# Patient Record
Sex: Female | Born: 1970 | Race: White | Hispanic: No | Smoking: Former smoker
Health system: Southern US, Community
[De-identification: ages and names within clinical notes are randomized; demographics above are authoritative.]

## PROBLEM LIST (undated history)

## (undated) DIAGNOSIS — E119 Type 2 diabetes mellitus without complications: Secondary | ICD-10-CM

## (undated) DIAGNOSIS — I10 Essential (primary) hypertension: Secondary | ICD-10-CM

---

## 1997-12-03 ENCOUNTER — Ambulatory Visit (HOSPITAL_COMMUNITY): Admission: RE | Admit: 1997-12-03 | Discharge: 1997-12-03 | Payer: Self-pay

## 1998-06-16 ENCOUNTER — Emergency Department (HOSPITAL_COMMUNITY): Admission: EM | Admit: 1998-06-16 | Discharge: 1998-06-16 | Payer: Self-pay | Admitting: Emergency Medicine

## 1998-09-17 ENCOUNTER — Emergency Department (HOSPITAL_COMMUNITY): Admission: EM | Admit: 1998-09-17 | Discharge: 1998-09-17 | Payer: Self-pay | Admitting: Internal Medicine

## 1998-09-17 ENCOUNTER — Encounter: Payer: Self-pay | Admitting: Internal Medicine

## 1999-01-01 ENCOUNTER — Encounter: Payer: Self-pay | Admitting: Vascular Surgery

## 1999-01-04 ENCOUNTER — Ambulatory Visit (HOSPITAL_COMMUNITY): Admission: RE | Admit: 1999-01-04 | Discharge: 1999-01-04 | Payer: Self-pay | Admitting: Vascular Surgery

## 1999-08-19 ENCOUNTER — Emergency Department (HOSPITAL_COMMUNITY): Admission: EM | Admit: 1999-08-19 | Discharge: 1999-08-19 | Payer: Self-pay | Admitting: Emergency Medicine

## 1999-10-26 ENCOUNTER — Emergency Department (HOSPITAL_COMMUNITY): Admission: EM | Admit: 1999-10-26 | Discharge: 1999-10-26 | Payer: Self-pay | Admitting: Emergency Medicine

## 1999-10-26 ENCOUNTER — Encounter: Payer: Self-pay | Admitting: Emergency Medicine

## 2000-05-12 ENCOUNTER — Emergency Department (HOSPITAL_COMMUNITY): Admission: EM | Admit: 2000-05-12 | Discharge: 2000-05-12 | Payer: Self-pay | Admitting: Internal Medicine

## 2000-07-12 ENCOUNTER — Emergency Department (HOSPITAL_COMMUNITY): Admission: EM | Admit: 2000-07-12 | Discharge: 2000-07-12 | Payer: Self-pay | Admitting: Emergency Medicine

## 2000-07-14 ENCOUNTER — Emergency Department (HOSPITAL_COMMUNITY): Admission: EM | Admit: 2000-07-14 | Discharge: 2000-07-14 | Payer: Self-pay | Admitting: Emergency Medicine

## 2000-07-30 ENCOUNTER — Emergency Department (HOSPITAL_COMMUNITY): Admission: EM | Admit: 2000-07-30 | Discharge: 2000-07-30 | Payer: Self-pay | Admitting: *Deleted

## 2000-10-19 ENCOUNTER — Inpatient Hospital Stay (HOSPITAL_COMMUNITY): Admission: AD | Admit: 2000-10-19 | Discharge: 2000-10-19 | Payer: Self-pay | Admitting: Obstetrics & Gynecology

## 2000-10-20 ENCOUNTER — Ambulatory Visit (HOSPITAL_COMMUNITY): Admission: RE | Admit: 2000-10-20 | Discharge: 2000-10-20 | Payer: Self-pay | Admitting: Obstetrics & Gynecology

## 2000-11-16 ENCOUNTER — Emergency Department (HOSPITAL_COMMUNITY): Admission: EM | Admit: 2000-11-16 | Discharge: 2000-11-17 | Payer: Self-pay | Admitting: Emergency Medicine

## 2000-11-17 ENCOUNTER — Encounter: Payer: Self-pay | Admitting: Emergency Medicine

## 2000-11-25 ENCOUNTER — Emergency Department (HOSPITAL_COMMUNITY): Admission: EM | Admit: 2000-11-25 | Discharge: 2000-11-25 | Payer: Self-pay | Admitting: Emergency Medicine

## 2001-12-10 ENCOUNTER — Other Ambulatory Visit: Admission: RE | Admit: 2001-12-10 | Discharge: 2001-12-10 | Payer: Self-pay | Admitting: Family Medicine

## 2002-03-31 ENCOUNTER — Emergency Department (HOSPITAL_COMMUNITY): Admission: EM | Admit: 2002-03-31 | Discharge: 2002-03-31 | Payer: Self-pay | Admitting: Emergency Medicine

## 2002-03-31 ENCOUNTER — Encounter: Payer: Self-pay | Admitting: Emergency Medicine

## 2002-04-14 ENCOUNTER — Emergency Department (HOSPITAL_COMMUNITY): Admission: EM | Admit: 2002-04-14 | Discharge: 2002-04-14 | Payer: Self-pay | Admitting: Emergency Medicine

## 2002-10-20 ENCOUNTER — Emergency Department (HOSPITAL_COMMUNITY): Admission: EM | Admit: 2002-10-20 | Discharge: 2002-10-20 | Payer: Self-pay | Admitting: Emergency Medicine

## 2004-02-12 ENCOUNTER — Emergency Department (HOSPITAL_COMMUNITY): Admission: EM | Admit: 2004-02-12 | Discharge: 2004-02-12 | Payer: Self-pay | Admitting: Emergency Medicine

## 2004-02-23 ENCOUNTER — Emergency Department (HOSPITAL_COMMUNITY): Admission: EM | Admit: 2004-02-23 | Discharge: 2004-02-23 | Payer: Self-pay | Admitting: Emergency Medicine

## 2004-05-24 ENCOUNTER — Emergency Department (HOSPITAL_COMMUNITY): Admission: EM | Admit: 2004-05-24 | Discharge: 2004-05-24 | Payer: Self-pay | Admitting: Emergency Medicine

## 2004-07-21 ENCOUNTER — Emergency Department (HOSPITAL_COMMUNITY): Admission: EM | Admit: 2004-07-21 | Discharge: 2004-07-21 | Payer: Self-pay | Admitting: Emergency Medicine

## 2004-09-21 ENCOUNTER — Ambulatory Visit: Payer: Self-pay | Admitting: Nurse Practitioner

## 2004-10-11 ENCOUNTER — Ambulatory Visit: Payer: Self-pay | Admitting: Internal Medicine

## 2004-10-13 ENCOUNTER — Ambulatory Visit: Payer: Self-pay | Admitting: Family Medicine

## 2004-10-16 ENCOUNTER — Emergency Department (HOSPITAL_COMMUNITY): Admission: EM | Admit: 2004-10-16 | Discharge: 2004-10-16 | Payer: Self-pay | Admitting: Emergency Medicine

## 2004-12-16 ENCOUNTER — Ambulatory Visit: Payer: Self-pay | Admitting: Nurse Practitioner

## 2005-01-18 ENCOUNTER — Ambulatory Visit: Payer: Self-pay | Admitting: Nurse Practitioner

## 2005-01-18 ENCOUNTER — Other Ambulatory Visit: Admission: RE | Admit: 2005-01-18 | Discharge: 2005-01-18 | Payer: Self-pay | Admitting: Family Medicine

## 2005-01-22 ENCOUNTER — Emergency Department (HOSPITAL_COMMUNITY): Admission: EM | Admit: 2005-01-22 | Discharge: 2005-01-22 | Payer: Self-pay | Admitting: Emergency Medicine

## 2005-03-02 ENCOUNTER — Emergency Department (HOSPITAL_COMMUNITY): Admission: EM | Admit: 2005-03-02 | Discharge: 2005-03-02 | Payer: Self-pay | Admitting: Emergency Medicine

## 2005-03-04 ENCOUNTER — Ambulatory Visit (HOSPITAL_COMMUNITY): Admission: RE | Admit: 2005-03-04 | Discharge: 2005-03-04 | Payer: Self-pay | Admitting: Family Medicine

## 2005-03-11 ENCOUNTER — Ambulatory Visit: Payer: Self-pay | Admitting: Family Medicine

## 2010-08-22 ENCOUNTER — Encounter: Payer: Self-pay | Admitting: Family Medicine

## 2015-08-04 ENCOUNTER — Emergency Department (HOSPITAL_COMMUNITY): Payer: Self-pay

## 2015-08-04 ENCOUNTER — Encounter (HOSPITAL_COMMUNITY): Payer: Self-pay | Admitting: Emergency Medicine

## 2015-08-04 ENCOUNTER — Emergency Department (HOSPITAL_COMMUNITY)
Admission: EM | Admit: 2015-08-04 | Discharge: 2015-08-04 | Disposition: A | Discharge status: Home / Self Care | Payer: Self-pay | Attending: Emergency Medicine | Admitting: Emergency Medicine

## 2015-08-04 DIAGNOSIS — R030 Elevated blood-pressure reading, without diagnosis of hypertension: Secondary | ICD-10-CM | POA: Insufficient documentation

## 2015-08-04 DIAGNOSIS — J209 Acute bronchitis, unspecified: Secondary | ICD-10-CM | POA: Insufficient documentation

## 2015-08-04 DIAGNOSIS — R000 Tachycardia, unspecified: Secondary | ICD-10-CM | POA: Insufficient documentation

## 2015-08-04 DIAGNOSIS — Z87891 Personal history of nicotine dependence: Secondary | ICD-10-CM | POA: Insufficient documentation

## 2015-08-04 DIAGNOSIS — IMO0001 Reserved for inherently not codable concepts without codable children: Secondary | ICD-10-CM

## 2015-08-04 MED ORDER — HYDROCODONE-ACETAMINOPHEN 5-325 MG PO TABS
ORAL_TABLET | ORAL | Status: DC
Start: 2015-08-04 — End: 2024-01-18

## 2015-08-04 MED ORDER — AZITHROMYCIN 250 MG PO TABS
ORAL_TABLET | ORAL | Status: DC
Start: 2015-08-04 — End: 2017-03-31

## 2015-08-04 NOTE — ED Notes (Signed)
Patient presents with nasal congestion and productive cough with green sputum x3 days. Denies fever/chills, N/V/D.

## 2015-08-04 NOTE — ED Provider Notes (Signed)
CSN: [id number]     Arrival date & time 08/04/15  1439 History  By signing my name below, I, Essence Howell, attest that this documentation has been prepared under the direction and in the presence of Wynetta Emery, PA-C Electronically Signed: Charline Bills, ED Scribe 08/04/2015 at 4:17 PM.   Chief Complaint  Patient presents with  . Cough  . Nasal Congestion   The history is provided by the patient. No language interpreter was used.   HPI Comments: Rita Ochoa is a 45 y.o. female who presents to the Emergency Department complaining of productive cough for the past 3-4 days with green sputum. Pt states that cough keeps her up at night. She reports associated chest congestion and fatigue. She denies fever, chest pain, sob, nausea, vomiting, diarrhea, constipation. Pt is a former smoker; quit many years ago. She denies any estrogen use, recent travels, long trips, calf swelling, h/o DVT/PE, palpations, syncope.  History reviewed. No pertinent past medical history. History reviewed. No pertinent past surgical history. No family history on file. Social History  Substance Use Topics  . Smoking status: Former Smoker    Quit date: 08/01/2014  . Smokeless tobacco: None  . Alcohol Use: No   OB History    No data available     Review of Systems A complete 10 system review of systems was obtained and all systems are negative except as noted in the HPI and PMH.   Allergies  Review of patient's allergies indicates no known allergies.  Home Medications   Prior to Admission medications   Not on File   BP 170/99 mmHg  Pulse 103  Temp(Src) 98.4 F (36.9 C) (Oral)  Resp 18  Ht 5\' 3"  (1.6 m)  Wt 215 lb (97.523 kg)  BMI 38.09 kg/m2  SpO2 97%  LMP 07/11/2015 Physical Exam  Constitutional: She is oriented to person, place, and time. She appears well-developed and well-nourished. No distress.  HENT:  Head: Normocephalic and atraumatic.  Right Ear: External ear normal.  Left Ear:  External ear normal.  Mouth/Throat: Oropharynx is clear and moist. No oropharyngeal exudate.  No drooling or stridor. Posterior pharynx mildly erythematous no significant tonsillar hypertrophy. No exudate. Soft palate rises symmetrically. No TTP or induration under tongue.   No tenderness to palpation of frontal or bilateral maxillary sinuses.  Mild mucosal edema in the nares with scant rhinorrhea.  Bilateral tympanic membranes with normal architecture and good light reflex.    Eyes: Conjunctivae and EOM are normal. Pupils are equal, round, and reactive to light.  Neck: Normal range of motion. Neck supple. No JVD present. No tracheal deviation present.  Cardiovascular: Normal rate, regular rhythm and intact distal pulses.   Pulmonary/Chest: Effort normal and breath sounds normal. No stridor. No respiratory distress. She has no wheezes. She has no rales. She exhibits no tenderness.  Abdominal: Soft. She exhibits no distension and no mass. There is no tenderness. There is no rebound and no guarding.  Musculoskeletal: Normal range of motion. She exhibits no edema or tenderness.  No calf asymmetry, superficial collaterals, palpable cords, edema, Homans sign negative bilaterally.    Neurological: She is alert and oriented to person, place, and time.  Skin: Skin is warm and dry. She is not diaphoretic.  Psychiatric: She has a normal mood and affect. Her behavior is normal.  Nursing note and vitals reviewed.  ED Course  Procedures (including critical care time) DIAGNOSTIC STUDIES: Oxygen Saturation is 97% on RA, normal by my interpretation.  COORDINATION OF CARE: 3:15 PM-Discussed treatment plan which includes CXR and Norco with pt at bedside and pt agreed to plan. Advised her that antibiotic would not be beneficial but she has added that she wants a z-pak since it helped her last time. She also requests a shot for fatigue.   Labs Review Labs Reviewed - No data to display  Imaging  Review Dg Chest 2 View  08/04/2015  CLINICAL DATA:  Cough and congestion EXAM: CHEST  2 VIEW COMPARISON:  November 28, 2014 FINDINGS: There is no edema or consolidation. The heart size and pulmonary vascularity are normal. No pneumothorax. No adenopathy. No bone lesions. IMPRESSION: No edema or consolidation. Electronically Signed   By: Bretta BangWilliam  Woodruff III M.D.   On: 08/04/2015 15:43   I have personally reviewed and evaluated these images and lab results as part of my medical decision-making.   EKG Interpretation None      MDM   Final diagnoses:  Acute bronchitis, unspecified organism  Elevated blood pressure    Filed Vitals:   08/04/15 1454 08/04/15 1620  BP: 170/99 181/91  Pulse: 103 100  Temp: 98.4 F (36.9 C)   TempSrc: Oral   Resp: 18   Height: 5\' 3"  (1.6 m)   Weight: 97.523 kg   SpO2: 97% 96%     Rita Ochoa is 45 y.o. female presenting with cough and chest congestion. NAD, Non-toxic appearing, AFVSS, LSCTA. XR negative. Patient is initially mildly tachycardic, I doubt this is DVT/PE. Likely viral illness, advised patient to push fluids, over-the-counter medications for symptom relief. Patient is adamant that she would like a Z-Pak, given the patient's request, advised her that antibiotics are not indicated for bronchitis and if you take antibiotics when you don't need them they will be less effective when they are needed.    Evaluation does not show pathology that would require ongoing emergent intervention or inpatient treatment. Pt is hemodynamically stable and mentating appropriately. Discussed findings and plan with patient/guardian, who agrees with care plan. All questions answered. Return precautions discussed and outpatient follow up given.   Discharge Medication List as of 08/04/2015  4:17 PM    START taking these medications   Details  azithromycin (ZITHROMAX Z-PAK) 250 MG tablet 2 po day one, then 1 daily x 4 days, Print    HYDROcodone-acetaminophen  (NORCO/VICODIN) 5-325 MG tablet Take 1-2 tablets by mouth every 6 hours as needed for pain or cough, Print         I personally performed the services described in this documentation, which was scribed in my presence. The recorded information has been reviewed and is accurate.    Wynetta Emeryicole Tylea Hise, PA-C 08/04/15 1743  Vanetta MuldersScott Zackowski, MD 08/06/15 2001

## 2015-08-04 NOTE — Discharge Instructions (Signed)
Please follow with your primary care doctor in the next 5 days for high blood pressure evaluation. If you do not have a primary care doctor, present to urgent care. Reduce salt intake. Seek emergency medical care for unilateral weakness, slurring, change in vision, or chest pain and shortness of breath.  Take vicodin for breakthrough pain, do not drink alcohol, drive, care for children or do other critical tasks while taking vicodin.  Do not hesitate to return to the emergency room for any new, worsening or concerning symptoms.  Please obtain primary care using resource guide below. Let them know that you were seen in the emergency room and that they will need to obtain records for further outpatient management.    Acute Bronchitis Bronchitis is inflammation of the airways that extend from the windpipe into the lungs (bronchi). The inflammation often causes mucus to develop. This leads to a cough, which is the most common symptom of bronchitis.  In acute bronchitis, the condition usually develops suddenly and goes away over time, usually in a couple weeks. Smoking, allergies, and asthma can make bronchitis worse. Repeated episodes of bronchitis may cause further lung problems.  CAUSES Acute bronchitis is most often caused by the same virus that causes a cold. The virus can spread from person to person (contagious) through coughing, sneezing, and touching contaminated objects. SIGNS AND SYMPTOMS   Cough.   Fever.   Coughing up mucus.   Body aches.   Chest congestion.   Chills.   Shortness of breath.   Sore throat.  DIAGNOSIS  Acute bronchitis is usually diagnosed through a physical exam. Your health care provider will also ask you questions about your medical history. Tests, such as chest X-rays, are sometimes done to rule out other conditions.  TREATMENT  Acute bronchitis usually goes away in a couple weeks. Oftentimes, no medical treatment is necessary. Medicines are  sometimes given for relief of fever or cough. Antibiotic medicines are usually not needed but may be prescribed in certain situations. In some cases, an inhaler may be recommended to help reduce shortness of breath and control the cough. A cool mist vaporizer may also be used to help thin bronchial secretions and make it easier to clear the chest.  HOME CARE INSTRUCTIONS  Get plenty of rest.   Drink enough fluids to keep your urine clear or pale yellow (unless you have a medical condition that requires fluid restriction). Increasing fluids may help thin your respiratory secretions (sputum) and reduce chest congestion, and it will prevent dehydration.   Take medicines only as directed by your health care provider.  If you were prescribed an antibiotic medicine, finish it all even if you start to feel better.  Avoid smoking and secondhand smoke. Exposure to cigarette smoke or irritating chemicals will make bronchitis worse. If you are a smoker, consider using nicotine gum or skin patches to help control withdrawal symptoms. Quitting smoking will help your lungs heal faster.   Reduce the chances of another bout of acute bronchitis by washing your hands frequently, avoiding people with cold symptoms, and trying not to touch your hands to your mouth, nose, or eyes.   Keep all follow-up visits as directed by your health care provider.  SEEK MEDICAL CARE IF: Your symptoms do not improve after 1 week of treatment.  SEEK IMMEDIATE MEDICAL CARE IF:  You develop an increased fever or chills.   You have chest pain.   You have severe shortness of breath.  You have bloody sputum.  You develop dehydration.  You faint or repeatedly feel like you are going to pass out.  You develop repeated vomiting.  You develop a severe headache. MAKE SURE YOU:   Understand these instructions.  Will watch your condition.  Will get help right away if you are not doing well or get worse.   This  information is not intended to replace advice given to you by your health care provider. Make sure you discuss any questions you have with your health care provider.   Document Released: 08/25/2004 Document Revised: 08/08/2014 Document Reviewed: 01/08/2013 Elsevier Interactive Patient Education 2016 [employer].  Emergency Department Resource Guide 1) Find a Doctor and Pay Out of Pocket Although you won't have to find out who is covered by your insurance plan, it is a good idea to ask around and get recommendations. You will then need to call the office and see if the doctor you have chosen will accept you as a new patient and what types of options they offer for patients who are self-pay. Some doctors offer discounts or will set up payment plans for their patients who do not have insurance, but you will need to ask so you aren't surprised when you get to your appointment.  2) Contact Your Local Health Department Not all health departments have doctors that can see patients for sick visits, but many do, so it is worth a call to see if yours does. If you don't know where your local health department is, you can check in your phone book. The CDC also has a tool to help you locate your state's health department, and many state websites also have listings of all of their local health departments.  3) Find a Walk-in Clinic If your illness is not likely to be very severe or complicated, you may want to try a walk in clinic. These are popping up all over the country in pharmacies, drugstores, and shopping centers. They're usually staffed by nurse practitioners or physician assistants that have been trained to treat common illnesses and complaints. They're usually fairly quick and inexpensive. However, if you have serious medical issues or chronic medical problems, these are probably not your best option.  No Primary Care Doctor: - Call Health Connect at  [phone] - they can help you locate a primary care  doctor that  accepts your insurance, provides certain services, etc. - Physician Referral Service- [phone]  Chronic Pain Problems: Organization         Address  Phone   Notes  [facility]  [phone] Patients need to be referred by their primary care doctor.   Medication Assistance: Organization         Address  Phone   Notes  [facility] Medication [facility] [address] [place] [place], [place] [address] [phone] --Must be a resident of [facility] -- Must have NO insurance coverage whatsoever (no Medicaid/ Medicare, etc.) -- The pt. MUST have a primary care doctor that directs their care regularly and follows them in the community   [facility]  [phone]   [employer]  [phone]    Agencies that provide inexpensive medical care: Organization         Address  Phone   Notes  [facility]  [phone]   [facility]    [phone]   [facility] [address] [place], [place] [address] [phone]   [facility] [address], [place] [phone]   Planned Parenthood    [phone]) [phone]   [facility]    [phone]) [phone]   [facility]  [address], [place] Phone:  [phone](336) [phone], Fax:  [phone] Hours of Operation:  9 am - 6 pm, M-F.  Also accepts Medicaid/Medicare and self-pay.  [facility]  [address], [place] Phone: [phone]) [phone], Fax: [phone](336) [phone]. Hours of Operation:  8:30 am - 5:30 pm, M-F.  Also accepts Medicaid and self-pay.  [facility] [place] [address], [place] Phone: [phone](336) [phone]   [facility] [address], [place], [place] [phone], Ext. [phone] Mondays & Thursdays: 7-9 AM.  First 15 patients are seen on a first come, first serve basis.    Medicaid-accepting [facility]  Providers:  Organization         Address  Phone   Notes  [facility] [address], [place] [phone](336) [phone] Also accepts self-pay patients.  [facility] [address], [place]  [phone]) [phone]   [facility] [address], [place] [phone]) [phone]   [facility] [address] [place] [address], [place] [phone](336) [phone]   Renaye RakersVeita Bland [address], [place]   [phone] Only accepts [place] Access [place] patients after they have their name applied to their card.   Self-Pay (no insurance) in [facility]:  Organization         Address  Phone   Notes  Sickle Cell Patients, [facility] [address], [place] [phone]) [phone]   [facility] [address], [place] [phone]) [phone]   [facility] [place]  [address], [place] [phone]) [phone]   [facility]/Dr. Osei-Bonsu  [address] [place] [address], [place] or [phone], [place] [phone]) [phone] Phone number for both [place] and [place] locations is the same.  [facility] [address], [place] [phone]) [phone]   [facility] [address] [place] [address], [place] or [address] [phone](336) [phone]) [phone]   [facility] [address], [place] [phone](336) [phone], phone; [phone](336) [phone], fax Sees patients 1st and 3rd Saturday of every month.  Must not qualify for public or private insurance (i.e. Medicaid, Medicare, [place] Health Choice, Veterans' Benefits)  Household income should be no more than 200% of the poverty level The clinic cannot treat you if you are pregnant or think you are pregnant  Sexually transmitted diseases are not treated at the clinic.    Dental Care: Organization         Address  Phone  Notes  [facility] [address], [place] [phone]) [phone] Accepts children up to age 47 who are enrolled in [place] or [place] Health Choice; pregnant women with a Medicaid card; and children who have applied for Medicaid or [place] Health Choice, but were declined, whose parents can pay a reduced fee at time of service.  [facility] [place]  [address], [place] [phone]) [phone] Accepts children up to age 47 who are enrolled in [place] or [place] Health Choice; pregnant women with a Medicaid card; and children who  have applied for Medicaid or [place] Health Choice, but were declined, whose parents can pay a reduced fee at time of service.  [facility]  [address] [place], [place] [phone] Patients are seen by appointment only. Walk-ins are not accepted. [facility] will see patients 54 years of age and older. Monday - Tuesday (8am-5pm) Most Wednesdays (8:30-5pm) $30 per visit, cash only  [facility] Access [facility]  [address], [facility] [phone] Patients are seen by appointment only. Walk-ins are not accepted. [facility] will see patients 36 years of age and older. One Wednesday Evening (Monthly: Volunteer Based).  $30 per visit, cash only  [employer]  [phone] for adults; Children under age 52, call Graduate Pediatric Dentistry at [phone]. Children aged 28-14, please call [phone] to request a pediatric application.  Dental services are provided in all areas of dental care including fillings, crowns and bridges, complete and partial dentures, implants, gum treatment, root canals, and extractions. Preventive care is also provided. Treatment is provided to both adults and children. Patients are selected via a lottery and there is often a waiting list.   [facility] [address] [place]  [phone] [email or web address]   [facility]  [address] [place], [place] [phone], Ext. [phone] Second and Fourth Thursday of each month, opens at 6:30 AM; Clinic ends at 9 AM.  Patients are seen on a first-come first-served basis, and a limited number are seen during each clinic.   [facility]  [address] [place], [place] [phone]   Eligibility Requirements You must have lived in [place], [place], or [place] counties for at least the last three months.   You cannot be eligible for state or federal sponsored [place], including [employer], [place], or [employer].   You generally cannot be eligible for healthcare insurance through your employer.    How to apply: Eligibility screenings are held every Tuesday and Wednesday afternoon from 1:00 pm until 4:00 pm. You do not need an appointment for the interview!  [facility] [address], [place], [place] [phone]   [facility] Health Department  [phone]   [facility] Health Department  [phone]   [facility] Health Department  [phone]    Behavioral Health Resources in the Community: Intensive Outpatient Programs Organization         Address  Phone  Notes  [facility] [address], [place], [place] [phone]   [facility] Outpatient [address] [place], [place] [phone]   [facility] [address], [place], [place]  [phone]   [facility] [address]  [place], [place] [phone] or [phone]   Substance Abuse Resources Organization         Address  Phone  Notes  [facility] Drug [facility]  [phone]   [facility]  [phone]   [facility]  [phone]   Floydene Flock  [phone]   Residential & Outpatient Substance Abuse Program  [phone]   Psychological Services Organization         Address  Phone  Notes  [facility]  [phone]    [facility]  [phone]   [facility] [address] [place] [phone] or [phone]    Mobile Crisis Teams Organization  Address  Phone  Notes  [facility], Mobile Crisis Care Unit  [phone]   [facility]  [address] [place], [place] [phone]   [facility] De[facility]sch [address] [place] [phone]    Self-Help/Support Groups Organization         Address  Phone             Notes  [facility] [place] - variety of support groups  336- [phone] Call for more information  Narcotics Anonymous (NA), [facility] [address], [place]  2 meetings at this location   Statisticianesidential Treatment Programs Organization         Address  Phone  Notes  [facility] [address],    [place]  [phone]   [facility]  [address], [place] [phone], [place], [place] [phone]   [facility] [address], [place] [phone] Admissions: 8am-3pm M-F  [facility] [address]    [place], [place] [phone]   [facility] [address], [place], [place] [phone]   [facility] [address]  [place], [place] [phone]   [facility] - Intensive Outpatient [address], [place], [place] [phone]   [facility] [address]  [place], [place] [phone] or [phone]   [facility] ([facility]) [address] [place], [place] [phone] Accepts Medicaid  [facility] [address]  [place] [phone] Substance Abuse/Addiction Treatment   [facility]al Health Resources Organization         Address  Phone  Notes  [facility]  [phone]) [phone]   Angie FavaJulie Brannon, PhD [address], [address] [place], [place]   [phone](336) [phone] or [phone](336) [phone]    [facility]   [address] [place], [place] [phone](336) [phone]   [facility] [address], [place], [place] [phone]) [phone] Insurance/Medicaid/sponsorship through [facility]  [facility] [address]                                    [place], [place] [phone]) [phone] Therapy/tele-psych/case  [facility] [address]   [place], [place] [phone](336) [phone]    Dr. Lolly MustacheArfeen  [phone]) [phone]   [facility]  [employer] [facility] 1) [address], [place] 2) [address], [place] 3)  [address], [place] [phone]) [phone]) [phone]  [phone](336) [phone]   [facility] Child Abuse Hotline [phone](336) [phone] or [phone](336) [phone] (After Hours)

## 2015-08-06 DIAGNOSIS — Z6841 Body Mass Index (BMI) 40.0 and over, adult: Secondary | ICD-10-CM

## 2015-08-06 DIAGNOSIS — I10 Essential (primary) hypertension: Secondary | ICD-10-CM | POA: Insufficient documentation

## 2015-08-12 DIAGNOSIS — D509 Iron deficiency anemia, unspecified: Secondary | ICD-10-CM | POA: Insufficient documentation

## 2015-09-09 DIAGNOSIS — K439 Ventral hernia without obstruction or gangrene: Secondary | ICD-10-CM | POA: Insufficient documentation

## 2015-09-09 DIAGNOSIS — R7303 Prediabetes: Secondary | ICD-10-CM | POA: Insufficient documentation

## 2016-05-08 ENCOUNTER — Encounter (HOSPITAL_COMMUNITY): Payer: Self-pay | Admitting: Emergency Medicine

## 2016-05-08 ENCOUNTER — Emergency Department (HOSPITAL_COMMUNITY)
Admission: EM | Admit: 2016-05-08 | Discharge: 2016-05-08 | Disposition: A | Discharge status: Home / Self Care | Payer: Self-pay | Attending: Emergency Medicine | Admitting: Emergency Medicine

## 2016-05-08 DIAGNOSIS — X501XXA Overexertion from prolonged static or awkward postures, initial encounter: Secondary | ICD-10-CM | POA: Insufficient documentation

## 2016-05-08 DIAGNOSIS — Z87891 Personal history of nicotine dependence: Secondary | ICD-10-CM | POA: Insufficient documentation

## 2016-05-08 DIAGNOSIS — M79605 Pain in left leg: Secondary | ICD-10-CM | POA: Insufficient documentation

## 2016-05-08 DIAGNOSIS — Y939 Activity, unspecified: Secondary | ICD-10-CM | POA: Insufficient documentation

## 2016-05-08 DIAGNOSIS — Y990 Civilian activity done for income or pay: Secondary | ICD-10-CM | POA: Insufficient documentation

## 2016-05-08 DIAGNOSIS — Y929 Unspecified place or not applicable: Secondary | ICD-10-CM | POA: Insufficient documentation

## 2016-05-08 LAB — D-DIMER, QUANTITATIVE (NOT AT ARMC): D DIMER QUANT: 0.4 ug{FEU}/mL (ref 0.00–0.50)

## 2016-05-08 MED ORDER — ACETAMINOPHEN 500 MG PO TABS: 1000.0000 mg | ORAL_TABLET | Freq: Four times a day (QID) | ORAL | 0 refills | Status: DC | PRN

## 2016-05-08 MED ORDER — NAPROXEN 500 MG PO TABS
500.0000 mg | ORAL_TABLET | Freq: Two times a day (BID) | ORAL | 0 refills | Status: DC
Start: 2016-05-08 — End: 2017-03-28

## 2016-05-08 NOTE — ED Provider Notes (Signed)
[facility]-EMERGENCY DEPT Provider Note   CSN: [id number] Arrival date & time: 05/08/16  1242  By signing my name below, I, Arianna Nassar, attest that this documentation has been prepared under the direction and in the presence of Bear StearnsKayla Tito Ausmus, PA-C.  Electronically Signed: Octavia HeirArianna Nassar, ED Scribe. 05/08/16. 1:49 PM.    History   Chief Complaint Chief Complaint  Patient presents with  . Leg Pain    The history is provided by the patient. No language interpreter was used.   HPI Comments: Rita Ochoa is a 45 y.o. female who presents to the Emergency Department complaining of sudden onset, gradual worsening, moderate left leg pain x 1 week. She notes the pain radiates into her left hip. Pt reports standing on her feet for ~ 10 hours a day at work. Pt is unable to bear weight to her left leg secondary to increased pain. There is no known injury noted. She was evaluated for her leg pain this past week and was prescribed an unknown medication that she states did not help with her pain. Pt denies chest pain, shortness of breath, leg swelling, color change, back pain, recent surgery, recent long car rides, or hx of DVT.  History reviewed. No pertinent past medical history.  There are no active problems to display for this patient.   History reviewed. No pertinent surgical history.  OB History    No data available       Home Medications    Prior to Admission medications   Medication Sig Start Date End Date Taking? Authorizing Provider  acetaminophen (TYLENOL) 500 MG tablet Take 2 tablets (1,000 mg total) by mouth every 6 (six) hours as needed. 05/08/16   Cheri FowlerKayla Alric Geise, PA-C  azithromycin (ZITHROMAX Z-PAK) 250 MG tablet 2 po day one, then 1 daily x 4 days 08/04/15   Joni ReiningNicole Pisciotta, PA-C  HYDROcodone-acetaminophen (NORCO/VICODIN) 5-325 MG tablet Take 1-2 tablets by mouth every 6 hours as needed for pain or cough 08/04/15   Joni ReiningNicole Pisciotta, PA-C  naproxen (NAPROSYN) 500 MG tablet Take 1 tablet  (500 mg total) by mouth 2 (two) times daily. 05/08/16   Cheri FowlerKayla Gerrianne Aydelott, PA-C    Family History History reviewed. No pertinent family history.  Social History Social History  Substance Use Topics  . Smoking status: Former Smoker    Quit date: 08/01/2014  . Smokeless tobacco: Not on file  . Alcohol use No     Allergies   Review of patient's allergies indicates no known allergies.   Review of Systems Review of Systems  Respiratory: Negative for shortness of breath.   Cardiovascular: Negative for chest pain.  Musculoskeletal: Positive for myalgias. Negative for back pain.  All other systems reviewed and are negative.    Physical Exam Updated Vital Signs BP (!) 170/101 (BP Location: Right Arm)   Pulse 114   Temp 98.7 F (37.1 C) (Oral)   Resp 18   SpO2 100%   Physical Exam  Constitutional: She is oriented to person, place, and time. She appears well-developed and well-nourished.  HENT:  Head: Normocephalic and atraumatic.  Right Ear: External ear normal.  Left Ear: External ear normal.  Eyes: Conjunctivae are normal. No scleral icterus.  Neck: No tracheal deviation present.  Cardiovascular:  Pulses:      Dorsalis pedis pulses are 2+ on the right side, and 2+ on the left side.  Pulmonary/Chest: Effort normal. No respiratory distress.  Abdominal: She exhibits no distension.  Musculoskeletal: Normal range of motion. She exhibits tenderness.  She exhibits no edema.       Right hip: Normal.       Left hip: Normal.       Right knee: Normal.       Left knee: Normal.       Right ankle: Normal.       Left ankle: Normal.       Left lower leg: She exhibits tenderness. She exhibits no bony tenderness and no swelling.  Left calf pain worsens with ankle dorsiflexion.   Neurological: She is alert and oriented to person, place, and time.  Normal gait. Normal strength and sensation of lower extremities bilaterally.   Skin: Skin is warm and dry.  No erythema, warmth, unilateral edema,  varicose veins, or palpable cords.   Psychiatric: She has a normal mood and affect. Her behavior is normal.  Nursing note and vitals reviewed.    ED Treatments / Results  DIAGNOSTIC STUDIES: Oxygen Saturation is 100% on RA, normal by my interpretation.  COORDINATION OF CARE:  1:46 PM Discussed treatment plan with pt at bedside and pt agreed to plan.  Labs (all labs ordered are listed, but only abnormal results are displayed) Labs Reviewed  D-DIMER, QUANTITATIVE (NOT AT [facility])    EKG  EKG Interpretation None       Radiology No results found.  Procedures Procedures (including critical care time)  Medications Ordered in ED Medications - No data to display   Initial Impression / Assessment and Plan / ED Course  I have reviewed the triage vital signs and the nursing notes.  Pertinent labs & imaging results that were available during my care of the patient were reviewed by me and considered in my medical decision making (see chart for details).  Clinical Course   No bony tenderness or injury to warrant imaging at this time.  Concern for DVT given calf pain, d-dimer negative.  Pt advised to follow up with orthopedics. Conservative therapy recommended and discussed. Patient will be discharged home & is agreeable with above plan. Returns precautions discussed. Pt appears safe for discharge.  I personally performed the services described in this documentation, which was scribed in my presence. The recorded information has been reviewed and is accurate.  Final Clinical Impressions(s) / ED Diagnoses   Final diagnoses:  Left leg pain    New Prescriptions New Prescriptions   ACETAMINOPHEN (TYLENOL) 500 MG TABLET    Take 2 tablets (1,000 mg total) by mouth every 6 (six) hours as needed.   NAPROXEN (NAPROSYN) 500 MG TABLET    Take 1 tablet (500 mg total) by mouth 2 (two) times daily.     Cheri Fowler, PA-C 05/08/16 1516    Canary Brim Tegeler, MD 05/08/16 1840

## 2016-05-08 NOTE — ED Triage Notes (Signed)
Pt c/o pain from lateral lower left leg radiating to hip, worse with weight-bearing. No injury. Skin is normothermic, normochromic. No SOB.

## 2016-05-08 NOTE — Discharge Instructions (Signed)
1. Continue home medications. 2. Start taking Naproxen twice daily and Tylenol 1000 mg every 6 hours.  Do not exceed 4 grams in 24 hours. 3.  Follow up with your PCP in 2-3 days for re-evaluation.  If you were given follow up with a specialist, please follow up with them accordingly.

## 2017-03-28 ENCOUNTER — Emergency Department (HOSPITAL_BASED_OUTPATIENT_CLINIC_OR_DEPARTMENT_OTHER)
Admission: EM | Admit: 2017-03-28 | Discharge: 2017-03-28 | Disposition: A | Discharge status: Home / Self Care | Payer: Self-pay | Attending: Emergency Medicine | Admitting: Emergency Medicine

## 2017-03-28 ENCOUNTER — Emergency Department (HOSPITAL_BASED_OUTPATIENT_CLINIC_OR_DEPARTMENT_OTHER): Payer: Self-pay

## 2017-03-28 ENCOUNTER — Encounter (HOSPITAL_BASED_OUTPATIENT_CLINIC_OR_DEPARTMENT_OTHER): Payer: Self-pay | Admitting: Emergency Medicine

## 2017-03-28 DIAGNOSIS — Z79899 Other long term (current) drug therapy: Secondary | ICD-10-CM | POA: Insufficient documentation

## 2017-03-28 DIAGNOSIS — M722 Plantar fascial fibromatosis: Secondary | ICD-10-CM | POA: Insufficient documentation

## 2017-03-28 DIAGNOSIS — I10 Essential (primary) hypertension: Secondary | ICD-10-CM | POA: Insufficient documentation

## 2017-03-28 DIAGNOSIS — Z87891 Personal history of nicotine dependence: Secondary | ICD-10-CM | POA: Insufficient documentation

## 2017-03-28 HISTORY — DX: Essential (primary) hypertension: I10

## 2017-03-28 MED ORDER — IBUPROFEN 400 MG PO TABS
600.0000 mg | ORAL_TABLET | Freq: Once | ORAL | Status: AC
  Administered 2017-03-28: 600 mg via ORAL
  Filled 2017-03-28: qty 1

## 2017-03-28 MED ORDER — NAPROXEN 500 MG PO TABS
500.0000 mg | ORAL_TABLET | Freq: Two times a day (BID) | ORAL | 0 refills | Status: DC
Start: 2017-03-28 — End: 2023-10-24

## 2017-03-28 NOTE — ED Notes (Signed)
Triage documentation by Josy Peaden RN not Fernando EMT  

## 2017-03-28 NOTE — ED Notes (Addendum)
Pt was not triage by me.

## 2017-03-28 NOTE — ED Triage Notes (Signed)
Right lower leg pain for a year. No injury.

## 2017-03-28 NOTE — ED Provider Notes (Signed)
[facility] Provider [facility]   CSN: [id number] Arrival date & time: 03/28/17  1746     History   Chief Complaint Chief Complaint  Patient presents with  . Leg Pain    HPI Rita Ochoa is a 46 y.o. female who presents emergency department today for right leg pain. The patient states that over the last 3-4 months she's been having pain in the bottom of her right foot from the heel to her arch that occurs in the morning when she gets out of bed upon standing and after long periods of rest upon standing. 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 The pain is lessened after periods of walking. She has not tried anything for this. She denies any trauma, fever, chills, numbness, tingling, decreased range of motion or weakness of the extremity. No leg swelling.  HPI  Past Medical History:  Diagnosis Date  . Hypertension     There are no active problems to display for this patient.   History reviewed. No pertinent surgical history.  OB History    No data available       Home Medications    Prior to Admission medications   Medication Sig Start Date End Date Taking? Authorizing Provider  losartan (COZAAR) 100 MG tablet Take 100 mg by mouth daily.   Yes [provider]  acetaminophen (TYLENOL) 500 MG tablet Take 2 tablets (1,000 mg total) by mouth every 6 (six) hours as needed. 05/08/16   Cheri Fowler, PA-C  azithromycin (ZITHROMAX Z-PAK) 250 MG tablet 2 po day one, then 1 daily x 4 days 08/04/15   Pisciotta, Joni Reining, PA-C  HYDROcodone-acetaminophen (NORCO/VICODIN) 5-325 MG tablet Take 1-2 tablets by mouth every 6 hours as needed for pain or cough 08/04/15   Pisciotta, Joni Reining, PA-C  naproxen (NAPROSYN) 500 MG tablet Take 1 tablet (500 mg total) by mouth 2 (two) times daily. 03/28/17   April Carlyon, Elmer Sow, PA-C    Family History No family history on file.  Social History Social History   Substance Use Topics  . Smoking status: Former Smoker    Quit date: 08/01/2014  . Smokeless tobacco: Never Used  . Alcohol use No     Allergies   Patient has no known allergies.   Review of Systems Review of Systems  Constitutional: Negative for chills and fever.  Cardiovascular: Negative for leg swelling.  Musculoskeletal: Positive for gait problem.       Foot pain  Skin: Negative for wound.  Neurological: Negative for weakness and numbness.     Physical Exam Updated Vital Signs BP 140/70 (BP Location: Right Arm)   Pulse 89   Temp 98.5 F (36.9 C) (Oral)   Resp 20   Ht 5\' 2"  (1.575 m)   Wt 97.5 kg (215 lb)   LMP 03/28/2017   SpO2 98%   BMI 39.32 kg/m   Physical Exam  Constitutional: She appears well-developed and well-nourished.  HENT:  Head: Normocephalic and atraumatic.  Right Ear: External ear normal.  Left Ear: External ear normal.  Eyes: Conjunctivae are normal. Right eye exhibits no discharge. Left eye exhibits no discharge. No scleral icterus.  Cardiovascular:  Pulses:      Dorsalis pedis pulses are 2+ on the right side, and 2+ on the left side.       Posterior tibial pulses are 2+ on the right side, and 2+ on the  left side.  Pulmonary/Chest: Effort normal. No respiratory distress.  Musculoskeletal:       Right ankle: She exhibits normal range of motion and no swelling. No tenderness. Achilles tendon exhibits pain.       Right foot: There is tenderness. There is normal range of motion.  Right foot: Tenderness of the plantar fascia. Tenderness worst with dorsiflexion of the ankle on palpation of the plantar fascia. Compartments soft. No calf swelling or tenderness bilaterally  Neurological: She is alert. A sensory deficit is present.  Skin: No pallor.  Psychiatric: She has a normal mood and affect.  Nursing note and vitals reviewed.   ED Treatments / Results  Labs (all labs ordered are listed, but only abnormal results are displayed) Labs Reviewed  - No data to display  EKG  EKG Interpretation None       Radiology Dg Foot Complete Right  Result Date: 03/28/2017 CLINICAL DATA:  Right foot pain for few months. EXAM: RIGHT FOOT COMPLETE - 3+ VIEW COMPARISON:  None. FINDINGS: There is no evidence of fracture or dislocation. There is flattening of the head of the second metatarsal, chronic. Plantar calcaneal spur and calcification at the Achilles tendon insertion to the calcaneus are noted. Soft tissues are unremarkable. IMPRESSION: No acute abnormality identified. Chronic flattening of head of second metatarsal. Electronically Signed   By: Sherian Rein M.D.   On: 03/28/2017 18:58    Procedures Procedures (including critical care time)  Medications Ordered in ED Medications  ibuprofen (ADVIL,MOTRIN) tablet 600 mg (600 mg Oral Given 03/28/17 1935)     Initial Impression / Assessment and Plan / ED Course  I have reviewed the triage vital signs and the nursing notes.  Pertinent labs & imaging results that were available during my care of the patient were reviewed by me and considered in my medical decision making (see chart for details).     Patient with plantar fasciitis of the right foot as supported by history and physical exam. X-rays further support this. No fracture or dislocation on x-ray. Conservative treatment recommended. Will place the patient on trial of NSAID therapy. Patient advised to do morning stretches prior to getting in the bed and also after periods of inactivity. Advised patient to get orthotics and to prevent being barefoot, wearing flip-flops, sandals, slippers as this may worsen the pain. While the patient follow with podiatry for continued pain. Specific return precautions discussed. The patient verbalized understanding and agreement with plan. All questions answered. No further questions at this time. The patient appears safe for discharge.  Final Clinical Impressions(s) / ED Diagnoses   Final diagnoses:    Plantar fasciitis    New Prescriptions Discharge Medication List as of 03/28/2017  7:19 PM       Jacinto Halim, PA-C 03/29/17 0128    Melene Plan, DO 03/29/17 1728

## 2017-03-28 NOTE — Discharge Instructions (Signed)
Your symptoms are consistent with plantar fasciitis. Your x-rays were consistent with this. Please follow below stretches immediately upon awakening before setting feet on ground. Also follow after long periods of standing or after long periods of inactivity. He stretches should be performed multiple times per day and will help alleviate her symptoms. I'll prescribe an anti-inflammatory medication occurring taken twice daily with food. Please do not combine this medication with ibuprofen. I am giving you a walking boot to wear for relief of symptoms. He may wear this or by over-the-counter orthotics to wear with your shoes. Please follow with podiatry for assistance symptoms and to discuss further treatment.   This exercise is performed seated, with the affected leg crossed over the contralateral leg. The hand on the affected side of the body is positioned with the fingers across the plantar aspect of the toes just distal to the metacarpophalangeal joints to pull the toes back (dorsally) toward the ankle and shin until feeling a stretching sensation in the arch of the foot (figure A). The tension in the plantar fascia should be confirmed by palpation (figure B). The stretch should be held for a count of 10 and repeated 10 times. The exercise should be performed three times each day.   Sit with your legs straight and loop a towel around your foot. Then pull the top part of your foot towards you. Hold it like that for 10 to 30 seconds. Repeat this five times each session and do two sessions a day. You can also push the ball of your foot against the towel. This exercises and strengthens the muscles in your foot.

## 2017-03-31 ENCOUNTER — Ambulatory Visit (INDEPENDENT_AMBULATORY_CARE_PROVIDER_SITE_OTHER): Payer: Self-pay | Admitting: Podiatry

## 2017-03-31 ENCOUNTER — Ambulatory Visit: Payer: Self-pay

## 2017-03-31 ENCOUNTER — Encounter: Payer: Self-pay | Admitting: Podiatry

## 2017-03-31 VITALS — BP 130/79 | HR 74 | Resp 16

## 2017-03-31 DIAGNOSIS — M7661 Achilles tendinitis, right leg: Secondary | ICD-10-CM

## 2017-03-31 DIAGNOSIS — M722 Plantar fascial fibromatosis: Secondary | ICD-10-CM

## 2017-03-31 MED ORDER — DEXAMETHASONE SODIUM PHOSPHATE 120 MG/30ML IJ SOLN
4.0000 mg | Freq: Once | INTRAMUSCULAR | Status: AC
  Administered 2017-03-31: 4 mg via INTRA_ARTICULAR

## 2017-03-31 MED ORDER — METHYLPREDNISOLONE 4 MG PO TBPK
ORAL_TABLET | ORAL | 0 refills | Status: DC
Start: 2017-03-31 — End: 2024-01-18

## 2017-03-31 MED ORDER — TRIAMCINOLONE ACETONIDE 10 MG/ML IJ SUSP
5.0000 mg | Freq: Once | INTRAMUSCULAR | Status: AC
  Administered 2017-03-31: 5 mg via INTRA_ARTICULAR

## 2017-03-31 MED ORDER — MELOXICAM 15 MG PO TABS: 15.0000 mg | ORAL_TABLET | Freq: Every day | ORAL | 0 refills | Status: DC

## 2017-03-31 NOTE — Progress Notes (Signed)
  Subjective:  Patient ID: Rita Ochoa, female    DOB: 02/03/1971,  MRN: [id number] HPI Chief Complaint  Patient presents with  . Foot Pain    Plantar heel and achilles area right - burning x few months, swelling, went to ER and they xrayed - referred here, taking naproxen    46 y.o. female presents with the above complaint. Reports pain to the bottom and back of her heel for several months. States that the one in the back hurts her most. States she has difficulty walking due to the pain. Taking Naproxen but otherwise hasn't tried anything else for the pain. Reports pain as burning. Worsening.  Past Medical History:  Diagnosis Date  . Hypertension    No past surgical history on file.  Current Outpatient Prescriptions:  .  acetaminophen (TYLENOL) 500 MG tablet, Take 2 tablets (1,000 mg total) by mouth every 6 (six) hours as needed., Disp: 30 tablet, Rfl: 0 .  HYDROcodone-acetaminophen (NORCO/VICODIN) 5-325 MG tablet, Take 1-2 tablets by mouth every 6 hours as needed for pain or cough, Disp: 15 tablet, Rfl: 0 .  losartan (COZAAR) 100 MG tablet, Take 100 mg by mouth daily., Disp: , Rfl:  .  meloxicam (MOBIC) 15 MG tablet, Take 1 tablet (15 mg total) by mouth daily., Disp: 30 tablet, Rfl: 0 .  methylPREDNISolone (MEDROL DOSEPAK) 4 MG TBPK tablet, 6 Day Taper Pack. Take as Directed., Disp: 21 tablet, Rfl: 0 .  naproxen (NAPROSYN) 500 MG tablet, Take 1 tablet (500 mg total) by mouth 2 (two) times daily., Disp: 30 tablet, Rfl: 0  No Known Allergies Review of Systems  Cardiovascular: Positive for leg swelling.  All other systems reviewed and are negative.  Objective:   Vitals:   03/31/17 1120  BP: 130/79  Pulse: 74  Resp: 16   General AA&O x3. Normal mood and affect.  Vascular Dorsalis pedis and posterior tibial pulses  present 2+ bilaterally  Capillary refill normal to all digits. Pedal hair growth normal.  Neurologic Epicritic sensation grossly present bilaterally.    Dermatologic No open lesions. Interspaces clear of maceration. Nails well groomed and normal in appearance.  Orthopedic: MMT 5/5 in dorsiflexion, plantarflexion, inversion, and eversion bilaterally. Tender to palpation at the calcaneal tuber right. No pain with calcaneal squeeze right. Ankle ROM diminished range of motion right. Silfverskiold Test: positive right Tender to palpation at the watershed area of the Achilles tendon with fullness noted on palpation..   Assessment & Plan:  Patient was evaluated and treated and all questions answered.  Achilles Tendonitis, right -XR reviewed from ED. Spurring noted but no acute fracture or dislocation. -Educated on stretching and icing of the affected limb. -Night splint offered.  -eRx for Medrol 6-day taper. Advised on how to take medication. -eRx for Meloxicam. Advised only to take upon completion of oral steroid taper.  Plantar Fasciitis, right - XR reviewed as above.  - Educated on icing and stretching. Instructions given.  - Injection delivered as below.  Procedure: Injection Tendon/Ligament Location: Right plantar fascia at the glabrous junction; medial approach. Skin Prep: Alcohol. Injectate: 1 cc 0.5% marcaine plain, 1 cc dexamethasone, 0.5 cc kenalog 10. Disposition: Patient tolerated procedure well. Injection site dressed with a band-aid.  Return in about 6 weeks (around 05/12/2017).

## 2017-03-31 NOTE — Patient Instructions (Signed)

## 2017-05-12 ENCOUNTER — Ambulatory Visit: Payer: Self-pay | Admitting: Podiatry

## 2017-07-13 ENCOUNTER — Encounter (HOSPITAL_COMMUNITY): Payer: Self-pay | Admitting: Emergency Medicine

## 2017-07-13 ENCOUNTER — Emergency Department (HOSPITAL_COMMUNITY): Payer: Self-pay

## 2017-07-13 ENCOUNTER — Emergency Department (HOSPITAL_COMMUNITY)
Admission: EM | Admit: 2017-07-13 | Discharge: 2017-07-13 | Disposition: A | Discharge status: Home / Self Care | Payer: Self-pay | Attending: Emergency Medicine | Admitting: Emergency Medicine

## 2017-07-13 DIAGNOSIS — Z87891 Personal history of nicotine dependence: Secondary | ICD-10-CM | POA: Insufficient documentation

## 2017-07-13 DIAGNOSIS — R058 Other specified cough: Secondary | ICD-10-CM

## 2017-07-13 DIAGNOSIS — I10 Essential (primary) hypertension: Secondary | ICD-10-CM | POA: Insufficient documentation

## 2017-07-13 DIAGNOSIS — R6883 Chills (without fever): Secondary | ICD-10-CM | POA: Insufficient documentation

## 2017-07-13 DIAGNOSIS — R5383 Other fatigue: Secondary | ICD-10-CM | POA: Insufficient documentation

## 2017-07-13 DIAGNOSIS — Z79899 Other long term (current) drug therapy: Secondary | ICD-10-CM | POA: Insufficient documentation

## 2017-07-13 DIAGNOSIS — R630 Anorexia: Secondary | ICD-10-CM | POA: Insufficient documentation

## 2017-07-13 DIAGNOSIS — R05 Cough: Secondary | ICD-10-CM | POA: Insufficient documentation

## 2017-07-13 MED ORDER — ALBUTEROL SULFATE HFA 108 (90 BASE) MCG/ACT IN AERS: 1.0000 | INHALATION_SPRAY | Freq: Four times a day (QID) | RESPIRATORY_TRACT | 0 refills | Status: DC | PRN

## 2017-07-13 MED ORDER — HYDROCODONE-HOMATROPINE 5-1.5 MG/5ML PO SYRP: 5.0000 mL | ORAL_SOLUTION | Freq: Four times a day (QID) | ORAL | 0 refills | Status: DC | PRN

## 2017-07-13 MED ORDER — IPRATROPIUM-ALBUTEROL 0.5-2.5 (3) MG/3ML IN SOLN
3.0000 mL | Freq: Once | RESPIRATORY_TRACT | Status: AC
  Administered 2017-07-13: 3 mL via RESPIRATORY_TRACT
  Filled 2017-07-13: qty 3

## 2017-07-13 NOTE — ED Provider Notes (Signed)
[facility]-EMERGENCY DEPT Provider Note   CSN: [id number] Arrival date & time: 07/13/17  1522     History   Chief Complaint Chief Complaint  Patient presents with  . cough  . Fatigue    HPI Rita Ochoa is a 46 y.o. female with history of prediabetes and hypertension presents to ED for evaluation of persistent cough 7 days associated with generalized fatigue, chills, decreased energy, decreased appetite. States initially cough was productive of green sputum however now cough is mostly dry. Having cough attacks mostly at night time. Has taken over-the-counter cold medicines for symptoms with mild relief. States her central chest and back were initially hurting from continued cough however this has improved. Denies current or exertional chest pain, shortness of breath, palpitations, dizziness, nausea, vomiting, abdominal pain, diarrhea, constipation or urinary symptoms. Thinks that she may have been exposed to a sick coworker. No previous history of asthma, COPD or tobacco use. No previous history of pneumonia.  HPI  Past Medical History:  Diagnosis Date  . Hypertension     Patient Active Problem List   Diagnosis Date Noted  . Prediabetes 09/09/2015  . Ventral hernia 09/09/2015  . Iron deficiency anemia 08/12/2015  . Essential hypertension 08/06/2015  . Morbid obesity with BMI of 40.0-44.9, adult (HCC) 08/06/2015    History reviewed. No pertinent surgical history.  OB History    No data available       Home Medications    Prior to Admission medications   Medication Sig Start Date End Date Taking? Authorizing Provider  acetaminophen (TYLENOL) 500 MG tablet Take 2 tablets (1,000 mg total) by mouth every 6 (six) hours as needed. 05/08/16   Cheri Fowler, PA-C  albuterol (PROVENTIL HFA;VENTOLIN HFA) 108 (90 Base) MCG/ACT inhaler Inhale 1-2 puffs into the lungs every 6 (six) hours as needed for wheezing or shortness of breath. 07/13/17   Liberty Handy, PA-C  HYDROcodone-acetaminophen (NORCO/VICODIN) 5-325 MG tablet Take 1-2 tablets by mouth every 6 hours as needed for pain or cough 08/04/15   Pisciotta, Joni Reining, PA-C  HYDROcodone-homatropine (HYCODAN) 5-1.5 MG/5ML syrup Take 5 mLs by mouth every 6 (six) hours as needed for cough. FOR DISRUPTIVE NIGHT TIME COUGH AND BODY ACHES 07/13/17   Liberty Handy, PA-C  losartan (COZAAR) 100 MG tablet Take 100 mg by mouth daily.    [provider]  meloxicam (MOBIC) 15 MG tablet Take 1 tablet (15 mg total) by mouth daily. 03/31/17   Park Liter, DPM  methylPREDNISolone (MEDROL DOSEPAK) 4 MG TBPK tablet 6 Day Taper Pack. Take as Directed. 03/31/17   Park Liter, DPM  naproxen (NAPROSYN) 500 MG tablet Take 1 tablet (500 mg total) by mouth 2 (two) times daily. 03/28/17   Maczis, Elmer Sow, PA-C    Family History No family history on file.  Social History Social History   Tobacco Use  . Smoking status: Former Smoker    Last attempt to quit: 08/01/2014    Years since quitting: 2.9  . Smokeless tobacco: Never Used  Substance Use Topics  . Alcohol use: No  . Drug use: No     Allergies   Patient has no known allergies.   Review of Systems Review of Systems  Constitutional: Positive for appetite change, chills and fatigue.  Respiratory: Positive for cough.   All other systems reviewed and are negative.    Physical Exam Updated Vital Signs BP (!) 166/102   Pulse 96   Temp 97.9 F (  36.6 C) (Oral)   Resp 18   LMP 06/13/2017 (Within Days)   SpO2 98%   Physical Exam  Constitutional: She is oriented to person, place, and time. She appears well-developed and well-nourished. No distress.  NAD.  HENT:  Head: Normocephalic and atraumatic.  Right Ear: External ear normal.  Left Ear: External ear normal.  Nose: Nose normal.  Eyes: Conjunctivae and EOM are normal. No scleral icterus.  Neck: Normal range of motion. Neck supple.  Cardiovascular: Regular rhythm and  normal heart sounds.  No murmur heard. Tachycardic. Hypertensive (h/o HTN)  Pulmonary/Chest: Effort normal. She has wheezes.  RR within normal limits. SpO2 within normal limits.  +Faint expiratory wheezing to left upper lobe, posteriorly No egophony.  Normal breathing effort. Patient speaking in full sentences.  Chest wall expansion symmetric.  No chest wall tenderness.  Musculoskeletal: Normal range of motion. She exhibits no deformity.  Neurological: She is alert and oriented to person, place, and time.  Skin: Skin is warm and dry. Capillary refill takes less than 2 seconds.  Psychiatric: She has a normal mood and affect. Her behavior is normal. Judgment and thought content normal.  Nursing note and vitals reviewed.    ED Treatments / Results  Labs (all labs ordered are listed, but only abnormal results are displayed) Labs Reviewed - No data to display  EKG  EKG Interpretation None       Radiology Dg Chest 2 View  Result Date: 07/13/2017 CLINICAL DATA:  Productive cough and fatigue for 1 week. EXAM: CHEST  2 VIEW COMPARISON:  08/04/2015 FINDINGS: The lungs are clear. The pulmonary vasculature is normal. Heart size is normal. Hilar and mediastinal contours are unremarkable. There is no pleural effusion. IMPRESSION: No active cardiopulmonary disease. Electronically Signed   By: Ellery Plunkaniel R Mitchell M.D.   On: 07/13/2017 19:15    Procedures Procedures (including critical care time)  Medications Ordered in ED Medications  ipratropium-albuterol (DUONEB) 0.5-2.5 (3) MG/3ML nebulizer solution 3 mL (3 mLs Nebulization Given 07/13/17 2000)     Initial Impression / Assessment and Plan / ED Course  I have reviewed the triage vital signs and the nursing notes.  Pertinent labs & imaging results that were available during my care of the patient were reviewed by me and considered in my medical decision making (see chart for details).    46 y.o. -year-old female with no significant  pmh presents with URI like symptoms  7 days which have improved however here for lingering dry cough. Known sick contacts. On my exam patient is nontoxic appearing, speaking in full sentences, w/o increased WOB. She is initially tachycardic and hypertensive. No tachypnea, fever, hypoxia. She has faint expiratory wheezing on exam. Given duration of symptoms and wheezing CXR obtained ad duoneb given.  CXR negative. Wheezing improved after duoneb, pt felt better as well. Tachycardia and hypertension resolved after breathing tx. No significant h/o immunocompromise. Doubt bacterial bronchitis or pneumonia.  Given reassuring physical exam, will discharge with symptomatic treatment. Strict ED return precautions given. Patient is aware that a viral URI infection may precede pneumonia or worsening illness. Patient is aware of red flag symptoms to monitor for that would warrant return to the ED for further reevaluation.    Final Clinical Impressions(s) / ED Diagnoses   Final diagnoses:  Post-viral cough syndrome    ED Discharge Orders        Ordered    albuterol (PROVENTIL HFA;VENTOLIN HFA) 108 (90 Base) MCG/ACT inhaler  Every 6 hours PRN  07/13/17 2016    HYDROcodone-homatropine (HYCODAN) 5-1.5 MG/5ML syrup  Every 6 hours PRN     07/13/17 2016       Jerrell MylarGibbons, Yaffa Seckman J, PA-C 07/13/17 2033    Rolland PorterJames, Mark, MD 07/21/17 0005

## 2017-07-13 NOTE — ED Triage Notes (Signed)
Patient reports that she had cough and fatigue for week. Cough was productive at first with green phlegm but now dry. Reports has no energy and not able to go to work this week.

## 2017-07-13 NOTE — Discharge Instructions (Signed)
Your chest x-ray was normal today. I suspect your symptoms are from a recent viral upper respiratory infection leading to postviral cough syndrome. Unfortunately a lingering cough is often common after a viral upper respiratory infection and can persist for 6-8 weeks. To help with your symptoms, use albuterol inhaler throughout the day as prescribed. You can take an over-the-counter cough medicine to suppress your cough during the day. At nighttime take Hycodan syrup which helps suppress cough and relief chest wall pain from coughing. Stay well-hydrated. Rest. Follow up with her primary care doctor in 1 week if your symptoms do not improve or they worsen.

## 2017-08-14 ENCOUNTER — Other Ambulatory Visit: Payer: Self-pay

## 2017-08-14 ENCOUNTER — Emergency Department (HOSPITAL_BASED_OUTPATIENT_CLINIC_OR_DEPARTMENT_OTHER)
Admission: EM | Admit: 2017-08-14 | Discharge: 2017-08-14 | Disposition: A | Discharge status: Home / Self Care | Payer: Self-pay | Attending: Emergency Medicine | Admitting: Emergency Medicine

## 2017-08-14 ENCOUNTER — Encounter (HOSPITAL_BASED_OUTPATIENT_CLINIC_OR_DEPARTMENT_OTHER): Payer: Self-pay | Admitting: Emergency Medicine

## 2017-08-14 DIAGNOSIS — Y929 Unspecified place or not applicable: Secondary | ICD-10-CM | POA: Insufficient documentation

## 2017-08-14 DIAGNOSIS — R109 Unspecified abdominal pain: Secondary | ICD-10-CM | POA: Insufficient documentation

## 2017-08-14 DIAGNOSIS — Z87891 Personal history of nicotine dependence: Secondary | ICD-10-CM | POA: Insufficient documentation

## 2017-08-14 DIAGNOSIS — Y9389 Activity, other specified: Secondary | ICD-10-CM | POA: Insufficient documentation

## 2017-08-14 DIAGNOSIS — Z79899 Other long term (current) drug therapy: Secondary | ICD-10-CM | POA: Insufficient documentation

## 2017-08-14 DIAGNOSIS — I10 Essential (primary) hypertension: Secondary | ICD-10-CM | POA: Insufficient documentation

## 2017-08-14 DIAGNOSIS — Y998 Other external cause status: Secondary | ICD-10-CM | POA: Insufficient documentation

## 2017-08-14 DIAGNOSIS — X509XXA Other and unspecified overexertion or strenuous movements or postures, initial encounter: Secondary | ICD-10-CM | POA: Insufficient documentation

## 2017-08-14 DIAGNOSIS — T148XXA Other injury of unspecified body region, initial encounter: Secondary | ICD-10-CM | POA: Insufficient documentation

## 2017-08-14 LAB — URINALYSIS, ROUTINE W REFLEX MICROSCOPIC
Bilirubin Urine: NEGATIVE
GLUCOSE, UA: NEGATIVE mg/dL
Ketones, ur: NEGATIVE mg/dL
Leukocytes, UA: NEGATIVE
Nitrite: NEGATIVE
Protein, ur: NEGATIVE mg/dL
SPECIFIC GRAVITY, URINE: 1.01 (ref 1.005–1.030)
pH: 6.5 (ref 5.0–8.0)

## 2017-08-14 LAB — PREGNANCY, URINE: Preg Test, Ur: NEGATIVE

## 2017-08-14 LAB — URINALYSIS, MICROSCOPIC (REFLEX)

## 2017-08-14 MED ORDER — CYCLOBENZAPRINE HCL 10 MG PO TABS: 5.0000 mg | ORAL_TABLET | Freq: Every day | ORAL | 0 refills | Status: AC

## 2017-08-14 MED ORDER — KETOROLAC TROMETHAMINE 60 MG/2ML IM SOLN
30.0000 mg | Freq: Once | INTRAMUSCULAR | Status: AC
  Administered 2017-08-14: 30 mg via INTRAMUSCULAR
  Filled 2017-08-14: qty 2

## 2017-08-14 NOTE — Discharge Instructions (Signed)
You may use over-the-counter Aleve (Naproxen) or Motrin (Ibuprofen), and/or Acetaminophen (Tylenol), topical muscle creams such as SalonPas, Icy Hot, Bengay, etc. Please stretch, apply heat, and have massage therapy for additional assistance. ° °

## 2017-08-14 NOTE — ED Triage Notes (Addendum)
Patient states that she woke up on Thursday with right sided flank pain, patient states that it hurt worse on - The patient reports that it hurts worse with movement  - patient states that she also is having High blood pressure and she is concerned about this

## 2017-08-14 NOTE — ED Provider Notes (Signed)
[facility] EMERGENCY DEPARTMENT Provider Note  CSN: [id number] Arrival date & time: 08/14/17 1515  Chief Complaint(s) Back Pain  HPI Rita Ochoa is a 47 y.o. female   The history is provided by the patient.  Back Pain   This is a new problem. Episode onset: 5 days. The problem occurs constantly. The problem has not changed since onset.The pain is associated with lifting heavy objects. The pain is present in the thoracic spine (right flank). The quality of the pain is described as aching. The pain does not radiate. The pain is moderate. The symptoms are aggravated by bending, twisting and certain positions. The pain is the same all the time. Pertinent negatives include no chest pain, no fever, no numbness, no abdominal pain, no abdominal swelling, no bowel incontinence, no perianal numbness, no bladder incontinence, no dysuria, no pelvic pain, no leg pain, no paresthesias and no tingling. She has tried NSAIDs (muscle cream) for the symptoms. The treatment provided significant (but pain returned the following morning) relief.   Currently on her menstrual cycle.  Past Medical History Past Medical History:  Diagnosis Date  . Hypertension    Patient Active Problem List   Diagnosis Date Noted  . Prediabetes 09/09/2015  . Ventral hernia 09/09/2015  . Iron deficiency anemia 08/12/2015  . Essential hypertension 08/06/2015  . Morbid obesity with BMI of 40.0-44.9, adult (HCC) 08/06/2015   Home Medication(s) Prior to Admission medications   Medication Sig Start Date End Date Taking? Authorizing Provider  acetaminophen (TYLENOL) 500 MG tablet Take 2 tablets (1,000 mg total) by mouth every 6 (six) hours as needed. 05/08/16   Cheri Fowler, PA-C  albuterol (PROVENTIL HFA;VENTOLIN HFA) 108 (90 Base) MCG/ACT inhaler Inhale 1-2 puffs into the lungs every 6 (six) hours as needed for wheezing or shortness of breath. 07/13/17   Liberty Handy, PA-C  cyclobenzaprine (FLEXERIL) 10 MG  tablet Take 0.5-1 tablets (5-10 mg total) by mouth at bedtime for 10 days. 08/14/17 08/24/17  Nira Conn, MD  HYDROcodone-acetaminophen (NORCO/VICODIN) 5-325 MG tablet Take 1-2 tablets by mouth every 6 hours as needed for pain or cough 08/04/15   Pisciotta, Joni Reining, PA-C  HYDROcodone-homatropine (HYCODAN) 5-1.5 MG/5ML syrup Take 5 mLs by mouth every 6 (six) hours as needed for cough. FOR DISRUPTIVE NIGHT TIME COUGH AND BODY ACHES 07/13/17   Liberty Handy, PA-C  losartan (COZAAR) 100 MG tablet Take 100 mg by mouth daily.    [provider]  meloxicam (MOBIC) 15 MG tablet Take 1 tablet (15 mg total) by mouth daily. 03/31/17   Park Liter, DPM  methylPREDNISolone (MEDROL DOSEPAK) 4 MG TBPK tablet 6 Day Taper Pack. Take as Directed. 03/31/17   Park Liter, DPM  naproxen (NAPROSYN) 500 MG tablet Take 1 tablet (500 mg total) by mouth 2 (two) times daily. 03/28/17   Maczis, Elmer Sow, PA-C  Past Surgical History History reviewed. No pertinent surgical history. Family History History reviewed. No pertinent family history.  Social History Social History   Tobacco Use  . Smoking status: Former Smoker    Last attempt to quit: 08/01/2014    Years since quitting: 3.0  . Smokeless tobacco: Never Used  Substance Use Topics  . Alcohol use: No  . Drug use: No   Allergies Patient has no known allergies.  Review of Systems Review of Systems  Constitutional: Negative for fever.  Cardiovascular: Negative for chest pain.  Gastrointestinal: Negative for abdominal pain and bowel incontinence.  Genitourinary: Positive for vaginal bleeding. Negative for bladder incontinence, difficulty urinating, dysuria, frequency, pelvic pain, urgency and vaginal discharge.  Musculoskeletal: Positive for back pain.  Neurological: Negative for tingling, numbness and  paresthesias.   All other systems are reviewed and are negative for acute change except as noted in the HPI  Physical Exam Vital Signs  I have reviewed the triage vital signs BP (!) 168/86 (BP Location: Left Arm)   Pulse 100   Temp 98.4 F (36.9 C) (Oral)   Resp 18   Ht 5\' 2"  (1.575 m)   Wt 108.9 kg (240 lb)   LMP 08/14/2017   SpO2 100%   BMI 43.90 kg/m   Physical Exam  Constitutional: She is oriented to person, place, and time. She appears well-developed and well-nourished. No distress.  HENT:  Head: Normocephalic and atraumatic.  Right Ear: External ear normal.  Left Ear: External ear normal.  Nose: Nose normal.  Eyes: Conjunctivae and EOM are normal. No scleral icterus.  Neck: Normal range of motion and phonation normal.  Cardiovascular: Normal rate and regular rhythm.  Pulmonary/Chest: Effort normal. No stridor. No respiratory distress.  Abdominal: She exhibits no distension. There is no tenderness. There is CVA tenderness (right). There is no rigidity, no rebound and no guarding.  Musculoskeletal: Normal range of motion. She exhibits no edema.       Thoracic back: She exhibits tenderness and spasm.       Back:  Neurological: She is alert and oriented to person, place, and time.  Spine Exam: Strength: 5/5 throughout LE bilaterally (hip flexion/extension, adduction/abduction; knee flexion/extension; foot dorsiflexion/plantarflexion, inversion/eversion; great toe inversion) Sensation: Intact to light touch in proximal and distal LE bilaterally    Skin: She is not diaphoretic.  Psychiatric: She has a normal mood and affect. Her behavior is normal.  Vitals reviewed.   ED Results and Treatments Labs (all labs ordered are listed, but only abnormal results are displayed) Labs Reviewed  URINALYSIS, ROUTINE W REFLEX MICROSCOPIC - Abnormal; Notable for the following components:      Result Value   Color, Urine STRAW (*)    APPearance HAZY (*)    Hgb urine dipstick  LARGE (*)    All other components within normal limits  URINALYSIS, MICROSCOPIC (REFLEX) - Abnormal; Notable for the following components:   Bacteria, UA RARE (*)    Squamous Epithelial / LPF 0-5 (*)    All other components within normal limits  PREGNANCY, URINE  EKG  EKG Interpretation  Date/Time:    Ventricular Rate:    PR Interval:    QRS Duration:   QT Interval:    QTC Calculation:   R Axis:     Text Interpretation:        Radiology No results found. Pertinent labs & imaging results that were available during my care of the patient were reviewed by me and considered in my medical decision making (see chart for details).  Medications Ordered in ED Medications - No data to display                                                                                                                                  Procedures Procedures EMERGENCY DEPARTMENT US RENAL EXAM  "Study: Limited Retroperitoneal Ultrasound of Kidneys"  INDICATIONS: Flank pain Long and short axis of both kidneys were obtained.   PERFORMED BY: Myself IMAGES ARCHIVED?: Yes LIMITATIONS: Body habitus VIEWS USED: Long axis and Short axis  INTERPRETATION: No Hydronephrosis, No Renal cyst, No Kidney stone    EMERGENCY DEPARTMENT BILIARY ULTRASOUND INTERPRETATION "Study: Limited Abdominal Ultrasound of the Gallbladder and Common Bile Duct."  INDICATIONS: Back pain Indication: Multiple views of the gallbladder and common bile duct were obtained in real-time with a Multi-frequency probe."  PERFORMED BY:  Myself IMAGES ARCHIVED?: Yes LIMITATIONS: Body habitus INTERPRETATION: Normal   (including critical care time)  Medical Decision Making / ED Course I have reviewed the nursing notes for this encounter and the patient's prior records (if available in EHR or on provided paperwork).      47 y.o. female presents with back pain in thoracic area for 5 days without signs of radicular pain. No acute traumatic onset. No red flag symptoms of fever, weight loss, saddle anesthesia, weakness, fecal/urinary incontinence or urinary retention.   UPT negative. UA with hematuria (likely due to menstrual cycle), no evidence of infection.  Suspect MSK etiology. No indication for imaging emergently. Patient was recommended to take short course of scheduled NSAIDs and engage in early mobility as definitive treatment. Return precautions discussed for worsening or new concerning symptoms.   The patient appears reasonably screened and/or stabilized for discharge and I doubt any other medical condition or other [facility] requiring further screening, evaluation, or treatment in the ED at this time prior to discharge.  Final Clinical Impression(s) / ED Diagnoses Final diagnoses:  Right flank pain  Muscle strain    Disposition: Discharge  Condition: Good  I have discussed the results, Dx and Tx plan with the patient who expressed understanding and agree(s) with the plan. Discharge instructions discussed at great length. The patient was given strict return precautions who verbalized understanding of the instructions. No further questions at time of discharge.    ED Discharge Orders        Ordered    cyclobenzaprine (FLEXERIL) 10 MG tablet  Daily at bedtime     08/14/17 1724  Follow Up: Grayce Sessions, NP [address] [place] [address] [phone]  Schedule an appointment as soon as possible for a visit in 2 weeks If symptoms do not improve or  worsen     This chart was dictated using voice recognition software.  Despite best efforts to proofread,  errors can occur which can change the documentation meaning.   Nira Conn, MD 08/14/17 1728

## 2018-08-04 ENCOUNTER — Emergency Department (HOSPITAL_BASED_OUTPATIENT_CLINIC_OR_DEPARTMENT_OTHER)
Admission: EM | Admit: 2018-08-04 | Discharge: 2018-08-04 | Disposition: A | Discharge status: Home / Self Care | Payer: Self-pay | Attending: Emergency Medicine | Admitting: Emergency Medicine

## 2018-08-04 ENCOUNTER — Emergency Department (HOSPITAL_BASED_OUTPATIENT_CLINIC_OR_DEPARTMENT_OTHER): Payer: Self-pay

## 2018-08-04 ENCOUNTER — Other Ambulatory Visit: Payer: Self-pay

## 2018-08-04 ENCOUNTER — Encounter (HOSPITAL_BASED_OUTPATIENT_CLINIC_OR_DEPARTMENT_OTHER): Payer: Self-pay

## 2018-08-04 DIAGNOSIS — J069 Acute upper respiratory infection, unspecified: Secondary | ICD-10-CM | POA: Insufficient documentation

## 2018-08-04 DIAGNOSIS — Z7984 Long term (current) use of oral hypoglycemic drugs: Secondary | ICD-10-CM | POA: Insufficient documentation

## 2018-08-04 DIAGNOSIS — B9789 Other viral agents as the cause of diseases classified elsewhere: Secondary | ICD-10-CM

## 2018-08-04 DIAGNOSIS — E119 Type 2 diabetes mellitus without complications: Secondary | ICD-10-CM | POA: Insufficient documentation

## 2018-08-04 DIAGNOSIS — I10 Essential (primary) hypertension: Secondary | ICD-10-CM | POA: Insufficient documentation

## 2018-08-04 DIAGNOSIS — Z87891 Personal history of nicotine dependence: Secondary | ICD-10-CM | POA: Insufficient documentation

## 2018-08-04 DIAGNOSIS — Z79899 Other long term (current) drug therapy: Secondary | ICD-10-CM | POA: Insufficient documentation

## 2018-08-04 HISTORY — DX: Type 2 diabetes mellitus without complications: E11.9

## 2018-08-04 MED ORDER — AEROCHAMBER PLUS FLO-VU MEDIUM MISC
1.0000 | Freq: Once | Status: AC
  Administered 2018-08-04: 1
  Filled 2018-08-04: qty 1

## 2018-08-04 MED ORDER — BENZONATATE 100 MG PO CAPS: 100.0000 mg | ORAL_CAPSULE | Freq: Three times a day (TID) | ORAL | 0 refills | Status: DC

## 2018-08-04 MED ORDER — ALBUTEROL SULFATE HFA 108 (90 BASE) MCG/ACT IN AERS
2.0000 | INHALATION_SPRAY | Freq: Once | RESPIRATORY_TRACT | Status: AC
  Administered 2018-08-04: 2 via RESPIRATORY_TRACT
  Filled 2018-08-04: qty 6.7

## 2018-08-04 NOTE — Discharge Instructions (Addendum)

## 2018-08-04 NOTE — ED Notes (Signed)
Patient states understanding of the d/c instructions. The computer in room would not work for her to sign the d/c paper work

## 2018-08-04 NOTE — ED Provider Notes (Signed)
[facility] EMERGENCY DEPARTMENT Provider Note   CSN: [id number] Arrival date & time: 08/04/18  1432     History   Chief Complaint Chief Complaint  Patient presents with  . Cough    HPI Rita Ochoa is a 48 y.o. female.  HPI   Patient is a 48 year old female with a history of diabetes, hypertension, who presents the emergency department today for evaluation of a cough that has been present for the last week.  Reports the cough is been intermittently productive of sputum.  She initially had fevers at home however fevers have improved.  States she has had fatigue.  She initially had some rhinorrhea congestion and sore throat however those have resolved.  She denies chest pain or shortness of breath.  States that she was having diarrhea however that resolved about 4 days ago.  No nausea vomiting or abdominal pain.  States she has tried taking over-the-counter medications for her symptoms however she continues to have a bad cough at night and is unable to sleep.  Past Medical History:  Diagnosis Date  . Diabetes mellitus without complication (HCC)   . Hypertension     Patient Active Problem List   Diagnosis Date Noted  . Prediabetes 09/09/2015  . Ventral hernia 09/09/2015  . Iron deficiency anemia 08/12/2015  . Essential hypertension 08/06/2015  . Morbid obesity with BMI of 40.0-44.9, adult (HCC) 08/06/2015    History reviewed. No pertinent surgical history.   OB History   No obstetric history on file.      Home Medications    Prior to Admission medications   Medication Sig Start Date End Date Taking? Authorizing Provider  amLODipine (NORVASC) 10 MG tablet Take 10 mg by mouth daily.   Yes [provider]  hydrochlorothiazide (HYDRODIURIL) 25 MG tablet Take 25 mg by mouth daily.   Yes [provider]  metFORMIN (GLUCOPHAGE) 1000 MG tablet Take 1,000 mg by mouth 2 (two) times daily with a meal.   Yes [provider]  metoprolol  succinate (TOPROL-XL) 100 MG 24 hr tablet Take 100 mg by mouth daily. Take with or immediately following a meal.   Yes [provider]  acetaminophen (TYLENOL) 500 MG tablet Take 2 tablets (1,000 mg total) by mouth every 6 (six) hours as needed. 05/08/16   Cheri Fowlerose, Kayla, PA-C  albuterol (PROVENTIL HFA;VENTOLIN HFA) 108 (90 Base) MCG/ACT inhaler Inhale 1-2 puffs into the lungs every 6 (six) hours as needed for wheezing or shortness of breath. 07/13/17   Liberty HandyGibbons, Claudia J, PA-C  benzonatate (TESSALON) 100 MG capsule Take 1 capsule (100 mg total) by mouth every 8 (eight) hours. 08/04/18   Jonasia Coiner S, PA-C  HYDROcodone-acetaminophen (NORCO/VICODIN) 5-325 MG tablet Take 1-2 tablets by mouth every 6 hours as needed for pain or cough 08/04/15   Pisciotta, Joni ReiningNicole, PA-C  HYDROcodone-homatropine (HYCODAN) 5-1.5 MG/5ML syrup Take 5 mLs by mouth every 6 (six) hours as needed for cough. FOR DISRUPTIVE NIGHT TIME COUGH AND BODY ACHES 07/13/17   Liberty HandyGibbons, Claudia J, PA-C  losartan (COZAAR) 100 MG tablet Take 100 mg by mouth daily.    [provider]  meloxicam (MOBIC) 15 MG tablet Take 1 tablet (15 mg total) by mouth daily. 03/31/17   Park LiterPrice, Michael J, DPM  methylPREDNISolone (MEDROL DOSEPAK) 4 MG TBPK tablet 6 Day Taper Pack. Take as Directed. 03/31/17   Park LiterPrice, Michael J, DPM  naproxen (NAPROSYN) 500 MG tablet Take 1 tablet (500 mg total) by mouth 2 (two) times  daily. 03/28/17   Maczis, Elmer Sow, PA-C    Family History No family history on file.  Social History Social History   Tobacco Use  . Smoking status: Former Smoker    Last attempt to quit: 08/01/2014    Years since quitting: 4.0  . Smokeless tobacco: Never Used  Substance Use Topics  . Alcohol use: No  . Drug use: No     Allergies   Patient has no known allergies.   Review of Systems Review of Systems  Constitutional: Positive for chills and fever.  HENT: Positive for congestion, rhinorrhea and sore throat.   Eyes:  Negative for visual disturbance.  Respiratory: Positive for cough. Negative for shortness of breath and wheezing.   Cardiovascular: Negative for chest pain.  Gastrointestinal: Positive for diarrhea (resolved). Negative for abdominal pain, constipation, nausea and vomiting.  Genitourinary: Negative for flank pain.  Musculoskeletal: Negative for back pain.  Skin: Negative for rash.  Neurological: Negative for headaches.     Physical Exam Updated Vital Signs BP (!) 158/85 (BP Location: Left Arm)   Pulse 68   Temp 98.4 F (36.9 C) (Oral)   Resp 20   Ht 5\' 2"  (1.575 m)   Wt 108.9 kg   LMP 08/04/2018   SpO2 96%   BMI 43.90 kg/m   Physical Exam Vitals signs and nursing note reviewed.  Constitutional:      General: She is not in acute distress.    Appearance: She is well-developed.  HENT:     Head: Normocephalic and atraumatic.     Nose: No congestion or rhinorrhea.     Mouth/Throat:     Mouth: Mucous membranes are moist.     Pharynx: Posterior oropharyngeal erythema present. No oropharyngeal exudate.     Comments: No tonsillar edema or exudate. Eyes:     Conjunctiva/sclera: Conjunctivae normal.     Pupils: Pupils are equal, round, and reactive to light.  Neck:     Musculoskeletal: Neck supple.  Cardiovascular:     Rate and Rhythm: Normal rate and regular rhythm.     Heart sounds: Normal heart sounds. No murmur.  Pulmonary:     Effort: Pulmonary effort is normal. No respiratory distress.     Breath sounds: Normal breath sounds. No stridor. No wheezing or rhonchi.  Abdominal:     General: Bowel sounds are normal.     Palpations: Abdomen is soft.     Tenderness: There is no abdominal tenderness.  Lymphadenopathy:     Cervical: No cervical adenopathy.  Skin:    General: Skin is warm and dry.  Neurological:     Mental Status: She is alert.  Psychiatric:        Mood and Affect: Mood normal.     ED Treatments / Results  Labs (all labs ordered are listed, but only  abnormal results are displayed) Labs Reviewed - No data to display  EKG None  Radiology Dg Chest 2 View  Result Date: 08/04/2018 CLINICAL DATA:  Cough and congestion.  Chest tightness for 9 days EXAM: CHEST - 2 VIEW COMPARISON:  07/13/2017 FINDINGS: Thoracic spondylosis. Cardiac and mediastinal margins appear normal. The lungs appear clear. No blunting of the costophrenic angles. IMPRESSION: No active cardiopulmonary disease. Electronically Signed   By: Gaylyn Rong M.D.   On: 08/04/2018 15:03    Procedures Procedures (including critical care time)  Medications Ordered in ED Medications  albuterol (PROVENTIL HFA;VENTOLIN HFA) 108 (90 Base) MCG/ACT inhaler 2 puff (2 puffs Inhalation Given  08/04/18 1715)  AEROCHAMBER PLUS FLO-VU MEDIUM MISC 1 each (1 each Other Given 08/04/18 1716)     Initial Impression / Assessment and Plan / ED Course  I have reviewed the triage vital signs and the nursing notes.  Pertinent labs & imaging results that were available during my care of the patient were reviewed by me and considered in my medical decision making (see chart for details).   Final Clinical Impressions(s) / ED Diagnoses   Final diagnoses:  Viral URI with cough   Pt CXR negative for acute infiltrate. Patients symptoms are consistent with URI, likely viral etiology. Discussed that antibiotics are not indicated for viral infections. Pt will be discharged with symptomatic treatment.  Verbalizes understanding and is agreeable with plan. Pt is hemodynamically stable & in NAD prior to dc.  ED Discharge Orders         Ordered    benzonatate (TESSALON) 100 MG capsule  Every 8 hours     08/04/18 1720           Karrie MeresCouture, Analeigh Aries S, PA-C 08/04/18 1720    Arby BarrettePfeiffer, Marcy, MD 08/04/18 2320

## 2018-08-04 NOTE — ED Triage Notes (Signed)
Pt reports congested cough, fever with TMax 100.0, fatigue, diarrhea x 9 days.

## 2019-07-22 ENCOUNTER — Telehealth (HOSPITAL_COMMUNITY): Payer: Self-pay

## 2019-07-22 NOTE — Telephone Encounter (Signed)
Telephoned patient at home number. Left message to call and schedule with [facility]. °

## 2019-09-27 ENCOUNTER — Telehealth: Payer: Self-pay

## 2019-09-27 NOTE — Telephone Encounter (Signed)
Telephoned patient at home number. Mailbox full, unable to leave a voice message from [facility].

## 2019-12-02 ENCOUNTER — Emergency Department (HOSPITAL_BASED_OUTPATIENT_CLINIC_OR_DEPARTMENT_OTHER)
Admission: EM | Admit: 2019-12-02 | Discharge: 2019-12-02 | Disposition: A | Discharge status: Home / Self Care | Payer: Self-pay | Attending: Emergency Medicine | Admitting: Emergency Medicine

## 2019-12-02 ENCOUNTER — Emergency Department (HOSPITAL_BASED_OUTPATIENT_CLINIC_OR_DEPARTMENT_OTHER): Payer: Self-pay

## 2019-12-02 ENCOUNTER — Encounter (HOSPITAL_BASED_OUTPATIENT_CLINIC_OR_DEPARTMENT_OTHER): Payer: Self-pay | Admitting: *Deleted

## 2019-12-02 ENCOUNTER — Other Ambulatory Visit: Payer: Self-pay

## 2019-12-02 DIAGNOSIS — R1030 Lower abdominal pain, unspecified: Secondary | ICD-10-CM | POA: Insufficient documentation

## 2019-12-02 DIAGNOSIS — E119 Type 2 diabetes mellitus without complications: Secondary | ICD-10-CM | POA: Insufficient documentation

## 2019-12-02 DIAGNOSIS — Z87891 Personal history of nicotine dependence: Secondary | ICD-10-CM | POA: Insufficient documentation

## 2019-12-02 DIAGNOSIS — K439 Ventral hernia without obstruction or gangrene: Secondary | ICD-10-CM | POA: Insufficient documentation

## 2019-12-02 DIAGNOSIS — I10 Essential (primary) hypertension: Secondary | ICD-10-CM | POA: Insufficient documentation

## 2019-12-02 DIAGNOSIS — R350 Frequency of micturition: Secondary | ICD-10-CM | POA: Insufficient documentation

## 2019-12-02 LAB — URINALYSIS, MICROSCOPIC (REFLEX): RBC / HPF: >50 RBC/hpf (ref 0–5)

## 2019-12-02 LAB — URINALYSIS, ROUTINE W REFLEX MICROSCOPIC
Bilirubin Urine: NEGATIVE
Glucose, UA: NEGATIVE mg/dL
Ketones, ur: NEGATIVE mg/dL
Nitrite: NEGATIVE
Protein, ur: 100 mg/dL — AB
Specific Gravity, Urine: 1.025 (ref 1.005–1.030)
pH: 5 (ref 5.0–8.0)

## 2019-12-02 LAB — CBG MONITORING, ED: Glucose-Capillary: 176 mg/dL — ABNORMAL HIGH (ref 70–99)

## 2019-12-02 MED ORDER — DICLOFENAC SODIUM 1 % EX GEL: 2.0000 g | Freq: Four times a day (QID) | CUTANEOUS | 0 refills | Status: DC | PRN

## 2019-12-02 MED ORDER — DICYCLOMINE HCL 20 MG PO TABS
20.0000 mg | ORAL_TABLET | Freq: Three times a day (TID) | ORAL | 0 refills | Status: DC | PRN
Start: 2019-12-02 — End: 2024-01-18

## 2019-12-02 NOTE — ED Triage Notes (Signed)
Back pain. Urinary frequency, dysuria. Fever on Saturday.

## 2019-12-02 NOTE — ED Provider Notes (Signed)
Emergency Department Provider Note   I have reviewed the triage vital signs and the nursing notes.   HISTORY  Chief Complaint Urinary Tract Infection   HPI Rita Ochoa is a 49 y.o. female with PMH of DM and HTN presents emergency department for evaluation of urine frequency with some lower abdominal pressure.  She does feel some discomfort in the flanks bilaterally.  Pain has been present for several days but denies specific dysuria.  She did start her menstrual cycle today but denies noticing vaginal discharge.  No fevers or chills.  Denies body aches.  No recent antibiotics.  She is on Metformin and has been taking this medication.  She does not check her blood sugars at home.  Pain in the back goes across and does not radiate down the legs.  No numbness or weakness in the legs.  Pain is occasionally sharp and worse with movement.  Past Medical History:  Diagnosis Date  . Diabetes mellitus without complication (HCC)   . Hypertension     Patient Active Problem List   Diagnosis Date Noted  . Prediabetes 09/09/2015  . Ventral hernia 09/09/2015  . Iron deficiency anemia 08/12/2015  . Essential hypertension 08/06/2015  . Morbid obesity with BMI of 40.0-44.9, adult (HCC) 08/06/2015    History reviewed. No pertinent surgical history.  Allergies Patient has no known allergies.  No family history on file.  Social History Social History   Tobacco Use  . Smoking status: Former Smoker    Quit date: 08/01/2014    Years since quitting: 5.3  . Smokeless tobacco: Never Used  Substance Use Topics  . Alcohol use: No  . Drug use: No    Review of Systems  Constitutional: No fever/chills Eyes: No visual changes. ENT: No sore throat. Cardiovascular: Denies chest pain. Respiratory: Denies shortness of breath. Gastrointestinal: Positive abdominal pain.  No nausea, no vomiting.  No diarrhea.  No constipation. Genitourinary: Negative for dysuria. Positive urine frequency.    Musculoskeletal: Positive for back pain. Skin: Negative for rash. Neurological: Negative for headaches, focal weakness or numbness.  10-point ROS otherwise negative.  ____________________________________________   PHYSICAL EXAM:  VITAL SIGNS: ED Triage Vitals  Enc Vitals Group     BP 12/02/19 1458 (!) 148/85     Pulse Rate 12/02/19 1458 97     Resp 12/02/19 1458 20     Temp 12/02/19 1458 98.2 F (36.8 C)     Temp Source 12/02/19 1458 Oral     SpO2 12/02/19 1458 96 %     Weight 12/02/19 1454 250 lb (113.4 kg)     Height 12/02/19 1454 5\' 2"  (1.575 m)   Constitutional: Alert and oriented. Well appearing and in no acute distress. Eyes: Conjunctivae are normal.  Head: Atraumatic. Nose: No congestion/rhinnorhea. Mouth/Throat: Mucous membranes are moist.   Neck: No stridor.  Cardiovascular: Normal rate, regular rhythm. Good peripheral circulation. Grossly normal heart sounds.   Respiratory: Normal respiratory effort.  No retractions. Lungs CTAB. Gastrointestinal: Soft and nontender. No distention. No CVA tenderness.  Musculoskeletal: No gross deformities of extremities. Neurologic:  Normal speech and language. No gross focal neurologic deficits are appreciated.  Skin:  Skin is warm, dry and intact. No rash noted.  ____________________________________________   LABS (all labs ordered are listed, but only abnormal results are displayed)  Labs Reviewed  URINE CULTURE - Abnormal; Notable for the following components:      Result Value   Culture MULTIPLE SPECIES PRESENT, SUGGEST RECOLLECTION (*)  All other components within normal limits  URINALYSIS, ROUTINE W REFLEX MICROSCOPIC - Abnormal; Notable for the following components:   Color, Urine RED (*)    APPearance TURBID (*)    Hgb urine dipstick LARGE (*)    Protein, ur 100 (*)    Leukocytes,Ua TRACE (*)    All other components within normal limits  URINALYSIS, MICROSCOPIC (REFLEX) - Abnormal; Notable for the following  components:   Bacteria, UA FEW (*)    All other components within normal limits  CBG MONITORING, ED - Abnormal; Notable for the following components:   Glucose-Capillary 176 (*)    All other components within normal limits   ____________________________________________  RADIOLOGY  CT Renal Stone Study  Result Date: 12/02/2019 CLINICAL DATA:  Back pain, fever, dysuria, hematuria and urinary frequency. EXAM: CT ABDOMEN AND PELVIS WITHOUT CONTRAST TECHNIQUE: Multidetector CT imaging of the abdomen and pelvis was performed following the standard protocol without IV contrast. COMPARISON:  Pelvic ultrasound dated 03/04/2005. Abdomen pelvis CT report dated 11/17/2000. FINDINGS: Lower chest: Unremarkable. Hepatobiliary: Mild diffuse low density of the liver relative to the spleen. Normal appearing gallbladder. Pancreas: Unremarkable. No pancreatic ductal dilatation or surrounding inflammatory changes. Spleen: Normal in size without focal abnormality. Adrenals/Urinary Tract: Adrenal glands are unremarkable. Kidneys are normal, without renal calculi, focal lesion, or hydronephrosis. Bladder is unremarkable. Stomach/Bowel: Multiple small sigmoid colon diverticula without evidence of diverticulitis. Normal appearing retrocecal appendix. Unremarkable stomach and small bowel. Vascular/Lymphatic: No significant vascular findings are present. No enlarged abdominal or pelvic lymph nodes. Reproductive: Uterus and bilateral adnexa are unremarkable. Other: Moderately large midline ventral hernia containing herniated fat, above the level of the umbilicus. There is also a small umbilical hernia containing fat. Musculoskeletal: Mild lumbar and lower thoracic spine degenerative changes. T10 vertebral body bone island. IMPRESSION: 1. No acute abnormality. 2. No visible urinary tract abnormality. 3. Mild diffuse hepatic steatosis. 4. Sigmoid diverticulosis. 5. Moderately large supraumbilical ventral hernia containing herniated  fat. 6. Small umbilical hernia containing fat. Electronically Signed   By: Claudie Revering M.D.   On: 12/02/2019 16:39    ____________________________________________   PROCEDURES  Procedure(s) performed:   Procedures  None ____________________________________________   INITIAL IMPRESSION / ASSESSMENT AND PLAN / ED COURSE  Pertinent labs & imaging results that were available during my care of the patient were reviewed by me and considered in my medical decision making (see chart for details).   Patient presents emergency department for evaluation of lower abdominal pain with urine frequency.  No recent antibiotics.  No tenderness on exam.  No CVA tenderness. Doubt peylo. Will check UA and CBG.   UA equivocal. Will send for culture. CT reviewed. No acute findings. Discussed surgery f/u if hernia become symptomatic. No tenderness here. Contact info provided at discharge. Discussed ED return precautions.  ____________________________________________  FINAL CLINICAL IMPRESSION(S) / ED DIAGNOSES  Final diagnoses:  Urine frequency  Lower abdominal pain  Ventral hernia without obstruction or gangrene     NEW OUTPATIENT MEDICATIONS STARTED DURING THIS VISIT:  Discharge Medication List as of 12/02/2019  4:47 PM    START taking these medications   Details  diclofenac Sodium (VOLTAREN) 1 % GEL Apply 2 g topically 4 (four) times daily as needed., Starting Mon 12/02/2019, Normal    dicyclomine (BENTYL) 20 MG tablet Take 1 tablet (20 mg total) by mouth 3 (three) times daily as needed for spasms (abdominal cramping)., Starting Mon 12/02/2019, Normal        Note:  This document  was prepared using Conservation officer, historic buildings and may include unintentional dictation errors.  Alona Bene, MD, [facility]    Kenneth Cuaresma, Arlyss Repress, MD 12/03/19 [phone]

## 2019-12-02 NOTE — Discharge Instructions (Addendum)
You were seen in the emergency department today with urine frequency along with back and lower abdomen pain.  Your urine does not show sign of infection.  Your blood sugar remains slightly elevated here despite your home medications.  This could be causing your urine frequency and I would like for you to call your primary care doctor first thing tomorrow to see if they like to change your diabetes medications.  Your CT scan showed a hernia in the front of your abdomen.  This is not serious at this point but if it begins to cause you pain or other problems you will need to see a surgeon regarding repair.   If you develop fever, chills, worsening pain, numbness, or other severe symptoms you should return to the ED for evaluation.

## 2019-12-03 LAB — URINE CULTURE

## 2020-02-03 ENCOUNTER — Encounter (HOSPITAL_BASED_OUTPATIENT_CLINIC_OR_DEPARTMENT_OTHER): Payer: Self-pay | Admitting: *Deleted

## 2020-02-03 ENCOUNTER — Other Ambulatory Visit: Payer: Self-pay

## 2020-02-03 ENCOUNTER — Emergency Department (HOSPITAL_BASED_OUTPATIENT_CLINIC_OR_DEPARTMENT_OTHER)
Admission: EM | Admit: 2020-02-03 | Discharge: 2020-02-03 | Disposition: A | Discharge status: Home / Self Care | Payer: Medicaid Other | Attending: Emergency Medicine | Admitting: Emergency Medicine

## 2020-02-03 DIAGNOSIS — Z87891 Personal history of nicotine dependence: Secondary | ICD-10-CM | POA: Insufficient documentation

## 2020-02-03 DIAGNOSIS — Z7984 Long term (current) use of oral hypoglycemic drugs: Secondary | ICD-10-CM | POA: Insufficient documentation

## 2020-02-03 DIAGNOSIS — E669 Obesity, unspecified: Secondary | ICD-10-CM | POA: Insufficient documentation

## 2020-02-03 DIAGNOSIS — Z79899 Other long term (current) drug therapy: Secondary | ICD-10-CM | POA: Insufficient documentation

## 2020-02-03 DIAGNOSIS — R739 Hyperglycemia, unspecified: Secondary | ICD-10-CM

## 2020-02-03 DIAGNOSIS — E1165 Type 2 diabetes mellitus with hyperglycemia: Secondary | ICD-10-CM | POA: Insufficient documentation

## 2020-02-03 DIAGNOSIS — I10 Essential (primary) hypertension: Secondary | ICD-10-CM | POA: Insufficient documentation

## 2020-02-03 LAB — COMPREHENSIVE METABOLIC PANEL
ALT: 29 U/L (ref 0–44)
AST: 24 U/L (ref 15–41)
Albumin: 4 g/dL (ref 3.5–5.0)
Alkaline Phosphatase: 76 U/L (ref 38–126)
Anion gap: 11 (ref 5–15)
BUN: 13 mg/dL (ref 6–20)
CO2: 27 mmol/L (ref 22–32)
Calcium: 9.4 mg/dL (ref 8.9–10.3)
Chloride: 98 mmol/L (ref 98–111)
Creatinine, Ser: 0.59 mg/dL (ref 0.44–1.00)
GFR calc Af Amer: >60 mL/min (ref >60)
GFR calc non Af Amer: >60 mL/min (ref >60)
Glucose, Bld: 83 mg/dL (ref 70–99)
Potassium: 3.9 mmol/L (ref 3.5–5.1)
Sodium: 136 mmol/L (ref 135–145)
Total Bilirubin: 0.4 mg/dL (ref 0.3–1.2)
Total Protein: 7.7 g/dL (ref 6.5–8.1)

## 2020-02-03 LAB — CBC WITH DIFFERENTIAL/PLATELET
Abs Immature Granulocytes: 0.04 10*3/uL (ref 0.00–0.07)
Basophils Absolute: 0.1 10*3/uL (ref 0.0–0.1)
Basophils Relative: 1 %
Eosinophils Absolute: 0.6 10*3/uL — ABNORMAL HIGH (ref 0.0–0.5)
Eosinophils Relative: 4 %
HCT: 39.3 % (ref 36.0–46.0)
Hemoglobin: 12.1 g/dL (ref 12.0–15.0)
Immature Granulocytes: 0 %
Lymphocytes Relative: 22 %
Lymphs Abs: 2.8 10*3/uL (ref 0.7–4.0)
MCH: 25.6 pg — ABNORMAL LOW (ref 26.0–34.0)
MCHC: 30.8 g/dL (ref 30.0–36.0)
MCV: 83.3 fL (ref 80.0–100.0)
Monocytes Absolute: 0.5 10*3/uL (ref 0.1–1.0)
Monocytes Relative: 4 %
Neutro Abs: 8.5 10*3/uL — ABNORMAL HIGH (ref 1.7–7.7)
Neutrophils Relative %: 69 %
Platelets: 352 10*3/uL (ref 150–400)
RBC: 4.72 MIL/uL (ref 3.87–5.11)
RDW: 15.6 % — ABNORMAL HIGH (ref 11.5–15.5)
WBC: 12.5 10*3/uL — ABNORMAL HIGH (ref 4.0–10.5)
nRBC: 0 % (ref 0.0–0.2)

## 2020-02-03 LAB — URINALYSIS, ROUTINE W REFLEX MICROSCOPIC
Bilirubin Urine: NEGATIVE
Glucose, UA: NEGATIVE mg/dL
Hgb urine dipstick: NEGATIVE
Ketones, ur: NEGATIVE mg/dL
Leukocytes,Ua: NEGATIVE
Nitrite: NEGATIVE
Protein, ur: NEGATIVE mg/dL
Specific Gravity, Urine: 1.025 (ref 1.005–1.030)
pH: 5.5 (ref 5.0–8.0)

## 2020-02-03 LAB — CBG MONITORING, ED: Glucose-Capillary: 115 mg/dL — ABNORMAL HIGH (ref 70–99)

## 2020-02-03 NOTE — ED Triage Notes (Signed)
Pt c/o increased BS of 300 at home x  1 day

## 2020-02-03 NOTE — ED Provider Notes (Signed)
[facility] EMERGENCY DEPARTMENT Provider Note   CSN: [id number] Arrival date & time: 02/03/20  1339     History Chief Complaint  Patient presents with  . Hyperglycemia    Rita Ochoa is a 49 y.o. female.  Patient is a 49 year old female who presents with elevated blood sugars.  She has a history of diabetes and hypertension.  She says she takes Metformin and one injectable diabetes medicine that is not insulin but she does not know the name of it.  She has not had any recent changes in her medications.  She said she routinely checks her sugars during the day and today she noticed it to be in the 200s and then it was a little over 300.  That prompted her to come in for an evaluation.  She says she does not feel bad at all.  No weakness.  No nausea or vomiting.  No recent fevers or other illnesses.  She does not report any change in her diet.  She did eat a pancake and orange juice this morning.        Past Medical History:  Diagnosis Date  . Diabetes mellitus without complication (HCC)   . Hypertension     Patient Active Problem List   Diagnosis Date Noted  . Prediabetes 09/09/2015  . Ventral hernia 09/09/2015  . Iron deficiency anemia 08/12/2015  . Essential hypertension 08/06/2015  . Morbid obesity with BMI of 40.0-44.9, adult (HCC) 08/06/2015    History reviewed. No pertinent surgical history.   OB History   No obstetric history on file.     No family history on file.  Social History   Tobacco Use  . Smoking status: Former Smoker    Quit date: 08/01/2014    Years since quitting: 5.5  . Smokeless tobacco: Never Used  Vaping Use  . Vaping Use: Never used  Substance Use Topics  . Alcohol use: No  . Drug use: No    Home Medications Prior to Admission medications   Medication Sig Start Date End Date Taking? Authorizing Provider  acetaminophen (TYLENOL) 500 MG tablet Take 2 tablets (1,000 mg total) by mouth every 6 (six) hours as needed. 05/08/16    Cheri Fowler, PA-C  albuterol (PROVENTIL HFA;VENTOLIN HFA) 108 (90 Base) MCG/ACT inhaler Inhale 1-2 puffs into the lungs every 6 (six) hours as needed for wheezing or shortness of breath. 07/13/17   Liberty Handy, PA-C  amLODipine (NORVASC) 10 MG tablet Take 10 mg by mouth daily.    [provider]  benzonatate (TESSALON) 100 MG capsule Take 1 capsule (100 mg total) by mouth every 8 (eight) hours. 08/04/18   Couture, Cortni S, PA-C  diclofenac Sodium (VOLTAREN) 1 % GEL Apply 2 g topically 4 (four) times daily as needed. 12/02/19   Long, Arlyss Repress, MD  dicyclomine (BENTYL) 20 MG tablet Take 1 tablet (20 mg total) by mouth 3 (three) times daily as needed for spasms (abdominal cramping). 12/02/19   Long, Arlyss Repress, MD  hydrochlorothiazide (HYDRODIURIL) 25 MG tablet Take 25 mg by mouth daily.    [provider]  HYDROcodone-acetaminophen (NORCO/VICODIN) 5-325 MG tablet Take 1-2 tablets by mouth every 6 hours as needed for pain or cough 08/04/15   Pisciotta, Joni Reining, PA-C  HYDROcodone-homatropine (HYCODAN) 5-1.5 MG/5ML syrup Take 5 mLs by mouth every 6 (six) hours as needed for cough. FOR DISRUPTIVE NIGHT TIME COUGH AND BODY ACHES 07/13/17   Liberty Handy, PA-C  losartan (COZAAR) 100 MG tablet  Take 100 mg by mouth daily.    [provider]  meloxicam (MOBIC) 15 MG tablet Take 1 tablet (15 mg total) by mouth daily. 03/31/17   Park Liter, DPM  metFORMIN (GLUCOPHAGE) 1000 MG tablet Take 1,000 mg by mouth 2 (two) times daily with a meal.    [provider]  methylPREDNISolone (MEDROL DOSEPAK) 4 MG TBPK tablet 6 Day Taper Pack. Take as Directed. 03/31/17   Park Liter, DPM  metoprolol succinate (TOPROL-XL) 100 MG 24 hr tablet Take 100 mg by mouth daily. Take with or immediately following a meal.    [provider]  naproxen (NAPROSYN) 500 MG tablet Take 1 tablet (500 mg total) by mouth 2 (two) times daily. 03/28/17   Maczis, Elmer Sow, PA-C    Allergies      Patient has no known allergies.  Review of Systems   Review of Systems  Constitutional: Negative for chills, diaphoresis, fatigue and fever.  HENT: Negative for congestion, rhinorrhea and sneezing.   Eyes: Negative.   Respiratory: Negative for cough, chest tightness and shortness of breath.   Cardiovascular: Negative for chest pain and leg swelling.  Gastrointestinal: Negative for abdominal pain, blood in stool, diarrhea, nausea and vomiting.  Genitourinary: Positive for frequency. Negative for difficulty urinating, flank pain and hematuria.  Musculoskeletal: Negative for arthralgias and back pain.  Skin: Negative for rash.  Neurological: Negative for dizziness, speech difficulty, weakness, numbness and headaches.    Physical Exam Updated Vital Signs BP 132/79 (BP Location: Right Arm)   Pulse 64   Temp 98.5 F (36.9 C)   Resp 16   Ht 5\' 1"  (1.549 m)   Wt 113.4 kg   LMP 01/07/2020   SpO2 98%   BMI 47.24 kg/m   Physical Exam Constitutional:      Appearance: She is well-developed. She is obese.  HENT:     Head: Normocephalic and atraumatic.  Eyes:     Pupils: Pupils are equal, round, and reactive to light.  Cardiovascular:     Rate and Rhythm: Normal rate and regular rhythm.     Heart sounds: Normal heart sounds.  Pulmonary:     Effort: Pulmonary effort is normal. No respiratory distress.     Breath sounds: Normal breath sounds. No wheezing or rales.  Chest:     Chest wall: No tenderness.  Abdominal:     General: Bowel sounds are normal.     Palpations: Abdomen is soft.     Tenderness: There is no abdominal tenderness. There is no guarding or rebound.  Musculoskeletal:        General: Normal range of motion.     Cervical back: Normal range of motion and neck supple.  Lymphadenopathy:     Cervical: No cervical adenopathy.  Skin:    General: Skin is warm and dry.     Findings: No rash.  Neurological:     Mental Status: She is alert and oriented to person, place,  and time.     ED Results / Procedures / Treatments   Labs (all labs ordered are listed, but only abnormal results are displayed) Labs Reviewed  CBC WITH DIFFERENTIAL/PLATELET - Abnormal; Notable for the following components:      Result Value   WBC 12.5 (*)    MCH 25.6 (*)    RDW 15.6 (*)    Neutro Abs 8.5 (*)    Eosinophils Absolute 0.6 (*)    All other components within normal limits  CBG  MONITORING, ED - Abnormal; Notable for the following components:   Glucose-Capillary 115 (*)    All other components within normal limits  URINALYSIS, ROUTINE W REFLEX MICROSCOPIC  COMPREHENSIVE METABOLIC PANEL    EKG None  Radiology No results found.  Procedures Procedures (including critical care time)  Medications Ordered in ED Medications - No data to display  ED Course  I have reviewed the triage vital signs and the nursing notes.  Pertinent labs & imaging results that were available during my care of the patient were reviewed by me and considered in my medical decision making (see chart for details).    MDM Rules/Calculators/A&P                         Patient is a 49 year old female who presents with elevated blood sugars.  She otherwise is asymptomatic.  Her blood sugar on arrival here was in the 100s.  On her blood work, it was 83.  There is no suggestions of DKA.  Her other labs are nonconcerning.  I did advise her that she probably needs to have her machine checked to make sure that is calibrated.  She can take it to her PCPs office and compare it against their office machine.  Return precautions were given.  Final Clinical Impression(s) / ED Diagnoses Final diagnoses:  Hyperglycemia    Rx / DC Orders ED Discharge Orders    None       Rolan Bucco, MD 02/03/20 1744

## 2020-02-03 NOTE — ED Notes (Signed)
2 unsuccessful attempts for blood  

## 2022-03-31 IMAGING — CT CT RENAL STONE PROTOCOL
2 of 4 series · 17 of 46 positions shown, 19 images · non-contrast
Comparison: Pelvic ultrasound dated 03/04/2005. Abdomen pelvis CT
report dated 11/17/2000.

CLINICAL DATA: Back pain, fever, dysuria, hematuria and urinary
frequency.

EXAM:
CT ABDOMEN AND PELVIS WITHOUT CONTRAST
TECHNIQUE: Multidetector CT imaging of the abdomen and pelvis was performed
following the standard protocol without IV contrast.

[Series 2: axial st · axial · 0.82mm/px · z∈[-853,-458]mm · 14 of 87 slices shown, 16 images]
[im 4/87  soft-tissue]
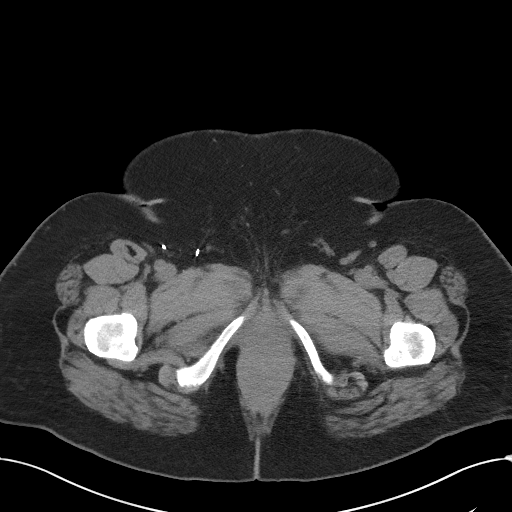
[im 4/87  bone]
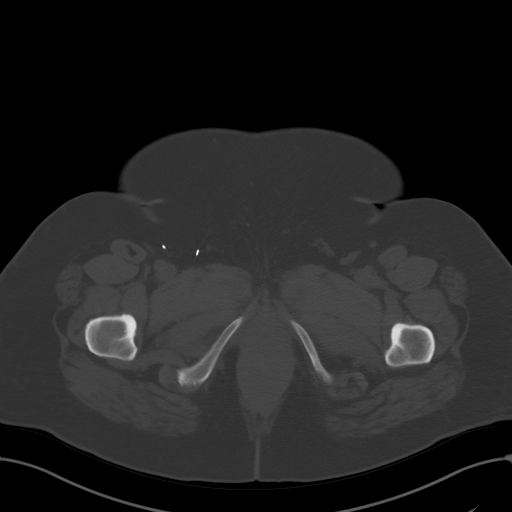
[im 12/87  soft-tissue]
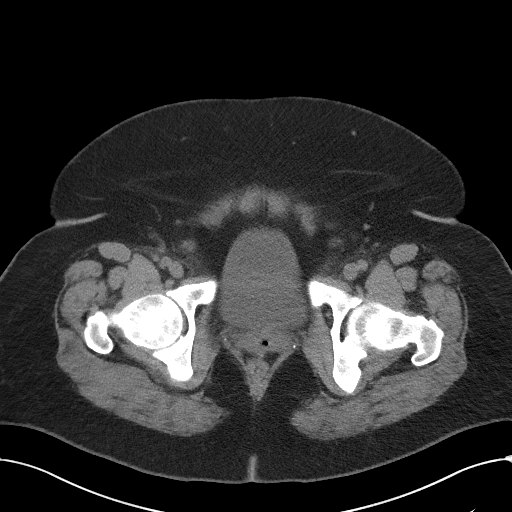
[im 15/87  soft-tissue]
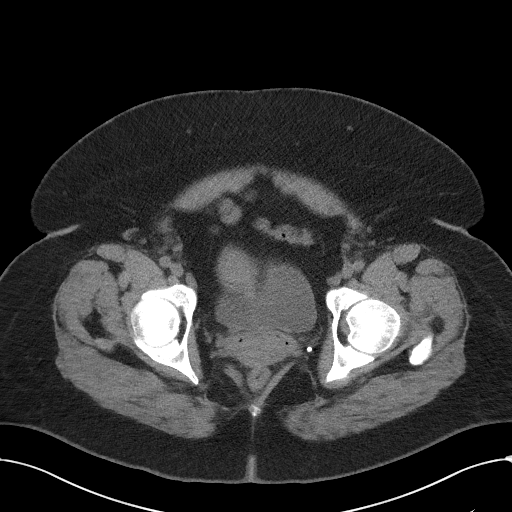
[im 23/87  soft-tissue]
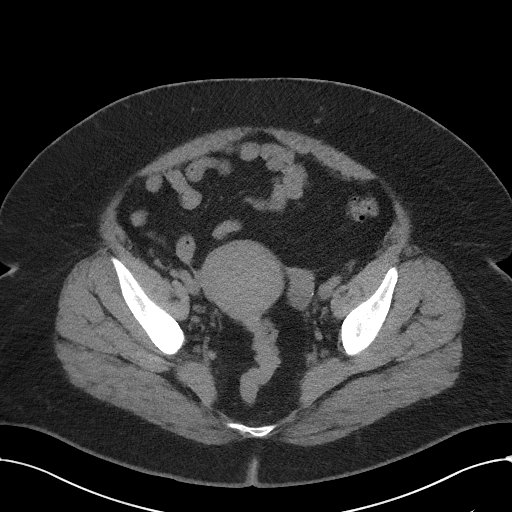
[im 30/87  soft-tissue]
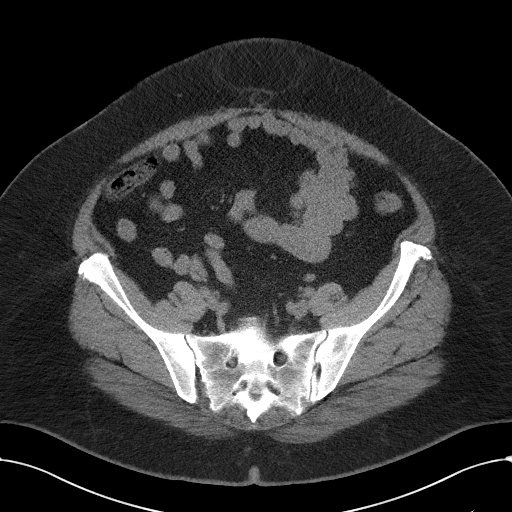
[im 34/87  soft-tissue]
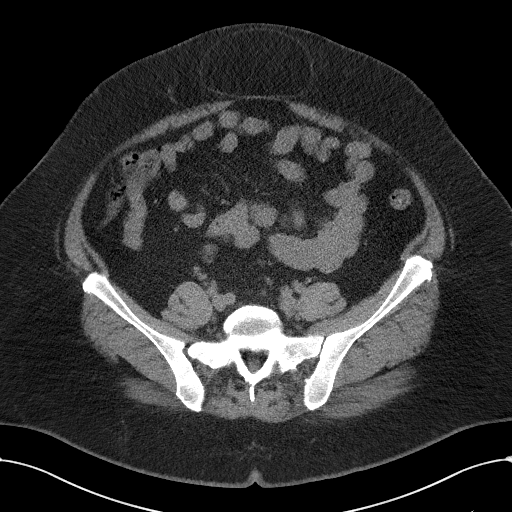
[im 42/87  soft-tissue]
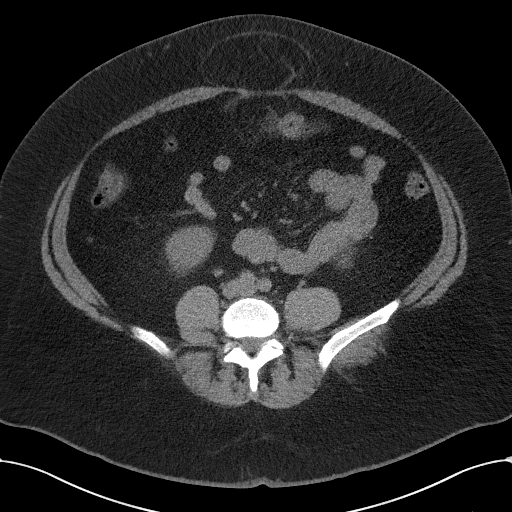
[im 45/87  soft-tissue]
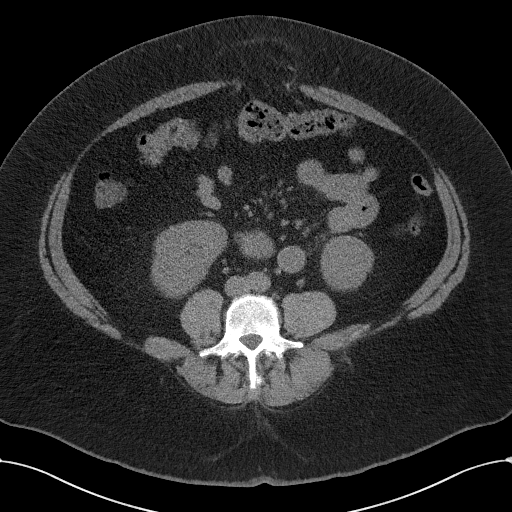
[im 53/87  soft-tissue]
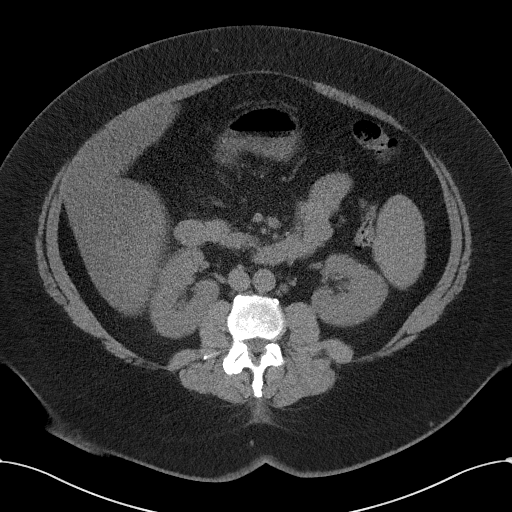
[im 53/87  bone]
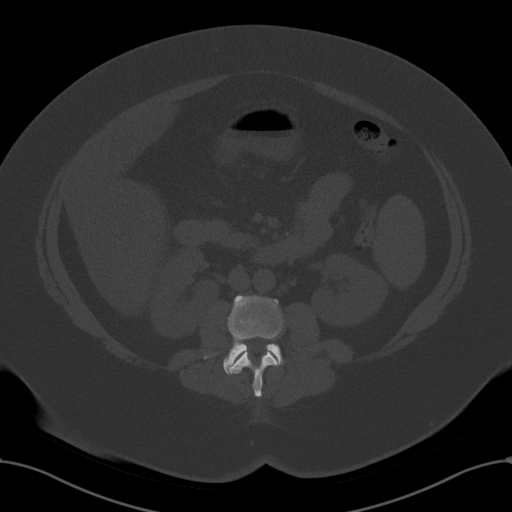
[im 57/87  soft-tissue]
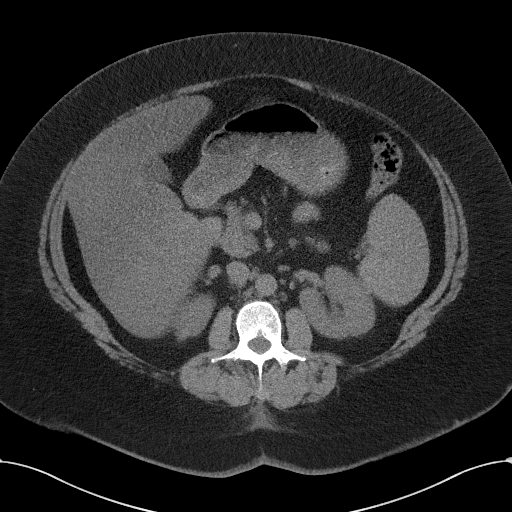
[im 64/87  soft-tissue]
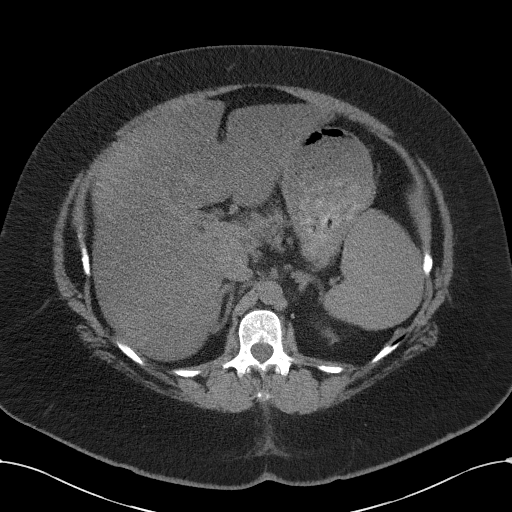
[im 72/87  soft-tissue]
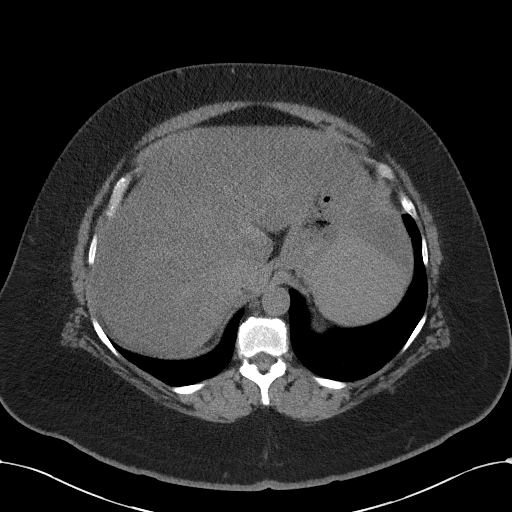
[im 75/87  soft-tissue]
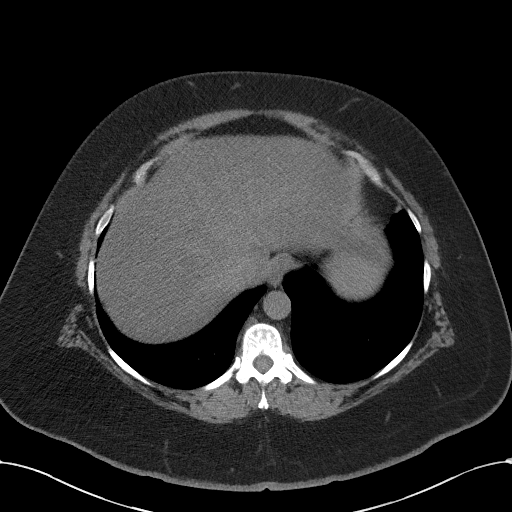
[im 83/87  soft-tissue]
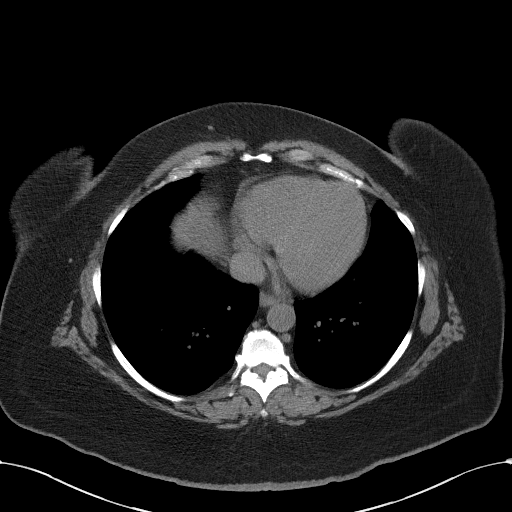

[Series 4: coronal st · coronal · 0.93mm/px · 3 of 134 slices shown]
[im 45/134  soft-tissue]
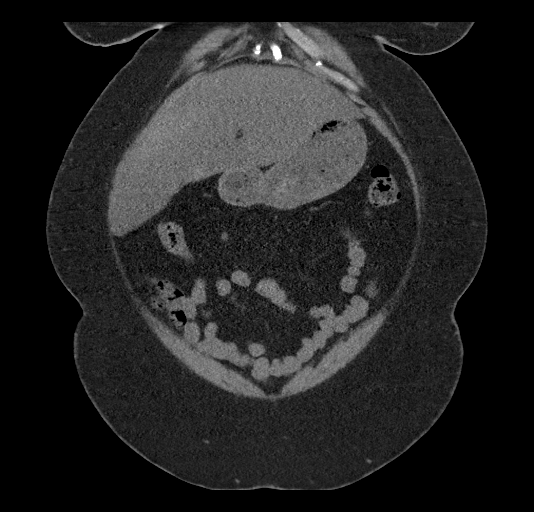
[im 60/134  soft-tissue]
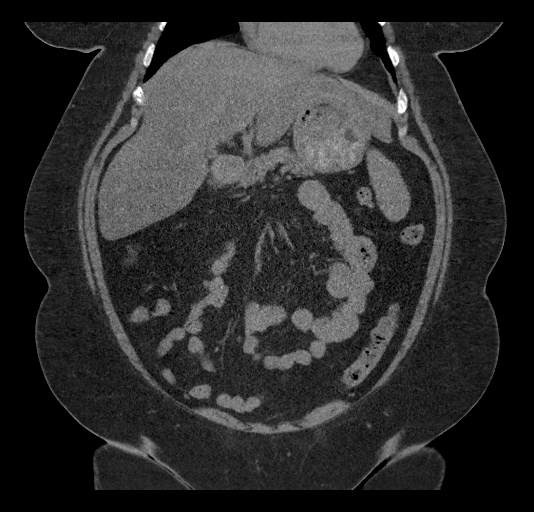
[im 74/134  soft-tissue]
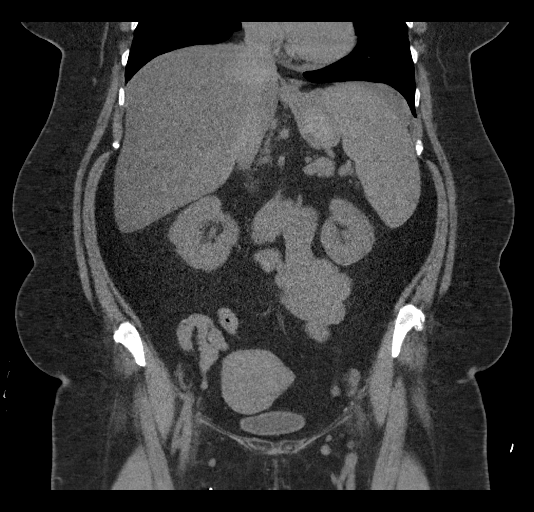

[17 of 46 positions shown; findings below may reference images not displayed]

FINDINGS: Lower chest: Unremarkable.

Hepatobiliary: Mild diffuse low density of the liver relative to the
spleen. Normal appearing gallbladder.

Pancreas: Unremarkable. No pancreatic ductal dilatation or
surrounding inflammatory changes.

Spleen: Normal in size without focal abnormality.

Adrenals/Urinary Tract: Adrenal glands are unremarkable. Kidneys are
normal, without renal calculi, focal lesion, or hydronephrosis.
Bladder is unremarkable.

Stomach/Bowel: Multiple small sigmoid colon diverticula without
evidence of diverticulitis. Normal appearing retrocecal appendix.
Unremarkable stomach and small bowel.

Vascular/Lymphatic: No significant vascular findings are present. No
enlarged abdominal or pelvic lymph nodes.

Reproductive: Uterus and bilateral adnexa are unremarkable.

Other: Moderately large midline ventral hernia containing herniated
fat, above the level of the umbilicus. There is also a small
umbilical hernia containing fat.

Musculoskeletal: Mild lumbar and lower thoracic spine degenerative
changes. T10 vertebral body bone island.
IMPRESSION: 1. No acute abnormality.
2. No visible urinary tract abnormality.
3. Mild diffuse hepatic steatosis.
4. Sigmoid diverticulosis.
5. Moderately large supraumbilical ventral hernia containing
herniated fat.
6. Small umbilical hernia containing fat.

## 2023-02-07 ENCOUNTER — Telehealth: Payer: Self-pay

## 2023-02-07 NOTE — Telephone Encounter (Signed)
Returned call to patient from VM.  No answer.  Left message and call back number

## 2023-10-24 ENCOUNTER — Ambulatory Visit
Admission: EM | Admit: 2023-10-24 | Discharge: 2023-10-24 | Disposition: A | Discharge status: Home / Self Care | Attending: Family Medicine | Admitting: Family Medicine

## 2023-10-24 DIAGNOSIS — J0110 Acute frontal sinusitis, unspecified: Secondary | ICD-10-CM

## 2023-10-24 DIAGNOSIS — J209 Acute bronchitis, unspecified: Secondary | ICD-10-CM

## 2023-10-24 MED ORDER — AZITHROMYCIN 250 MG PO TABS: ORAL_TABLET | ORAL | 0 refills | Status: DC

## 2023-10-24 MED ORDER — HYDROCOD POLI-CHLORPHE POLI ER 10-8 MG/5ML PO SUER: 5.0000 mL | Freq: Every evening | ORAL | 0 refills | Status: DC | PRN

## 2023-10-24 NOTE — ED Provider Notes (Signed)
 [facility]    CSN: [id number] Arrival date & time: 10/24/23  1143      History   Chief Complaint Chief Complaint  Patient presents with   Cough   Nasal Congestion    HPI Rita Ochoa is a 53 y.o. female.    Cough Associated symptoms: rhinorrhea   Associated symptoms: no shortness of breath and no wheezing    Patient is here for URI symptoms x 1 week.  Having a cough, fatigue and headache.  Runny nose, congestion.  Some sinus pain/pressure, improved with tylenol.  No wheezing or sob, but coughing so much it hurts.  Unable to sleep well due to the cough.  No fevers, chills.  Using otc meds without much help.        Past Medical History:  Diagnosis Date   Diabetes mellitus without complication (HCC)    Hypertension     Patient Active Problem List   Diagnosis Date Noted   Prediabetes 09/09/2015   Ventral hernia 09/09/2015   Iron deficiency anemia 08/12/2015   Essential hypertension 08/06/2015   Morbid obesity with BMI of 40.0-44.9, adult (HCC) 08/06/2015    No past surgical history on file.  OB History   No obstetric history on file.      Home Medications    Prior to Admission medications   Medication Sig Start Date End Date Taking? Authorizing Provider  acetaminophen (TYLENOL) 500 MG tablet Take 2 tablets (1,000 mg total) by mouth every 6 (six) hours as needed. 05/08/16   Cheri Fowler, PA-C  albuterol (PROVENTIL HFA;VENTOLIN HFA) 108 (90 Base) MCG/ACT inhaler Inhale 1-2 puffs into the lungs every 6 (six) hours as needed for wheezing or shortness of breath. Patient not taking: Reported on 10/24/2023 07/13/17   Liberty Handy, PA-C  amLODipine (NORVASC) 10 MG tablet Take 10 mg by mouth daily.    [provider]  benzonatate (TESSALON) 100 MG capsule Take 1 capsule (100 mg total) by mouth every 8 (eight) hours. 08/04/18   Couture, Cortni S, PA-C  diclofenac Sodium (VOLTAREN) 1 % GEL Apply 2 g topically 4 (four) times daily as needed.  12/02/19   Long, Arlyss Repress, MD  dicyclomine (BENTYL) 20 MG tablet Take 1 tablet (20 mg total) by mouth 3 (three) times daily as needed for spasms (abdominal cramping). 12/02/19   Long, Arlyss Repress, MD  hydrochlorothiazide (HYDRODIURIL) 25 MG tablet Take 25 mg by mouth daily.    [provider]  HYDROcodone-acetaminophen (NORCO/VICODIN) 5-325 MG tablet Take 1-2 tablets by mouth every 6 hours as needed for pain or cough 08/04/15   Pisciotta, Joni Reining, PA-C  HYDROcodone-homatropine (HYCODAN) 5-1.5 MG/5ML syrup Take 5 mLs by mouth every 6 (six) hours as needed for cough. FOR DISRUPTIVE NIGHT TIME COUGH AND BODY ACHES 07/13/17   Liberty Handy, PA-C  losartan (COZAAR) 100 MG tablet Take 100 mg by mouth daily.    [provider]  meloxicam (MOBIC) 15 MG tablet Take 1 tablet (15 mg total) by mouth daily. 03/31/17   Park Liter, DPM  metFORMIN (GLUCOPHAGE) 1000 MG tablet Take 1,000 mg by mouth 2 (two) times daily with a meal.    [provider]  methylPREDNISolone (MEDROL DOSEPAK) 4 MG TBPK tablet 6 Day Taper Pack. Take as Directed. 03/31/17   Park Liter, DPM  metoprolol succinate (TOPROL-XL) 100 MG 24 hr tablet Take 100 mg by mouth daily. Take with or immediately following a meal.    [provider]  naproxen (NAPROSYN) 500 MG tablet Take 1 tablet (500 mg total) by mouth 2 (two) times daily. 03/28/17   Maczis, Elmer Sow, PA-C    Family History No family history on file.  Social History Social History   Tobacco Use   Smoking status: Former    Current packs/day: 0.00    Types: Cigarettes    Quit date: 08/01/2014    Years since quitting: 9.2   Smokeless tobacco: Never  Vaping Use   Vaping status: Never Used  Substance Use Topics   Alcohol use: No   Drug use: No     Allergies   Patient has no known allergies.   Review of Systems Review of Systems  Constitutional:  Positive for fatigue.  HENT:  Positive for congestion and rhinorrhea.   Respiratory:   Positive for cough. Negative for shortness of breath and wheezing.   Cardiovascular: Negative.   Gastrointestinal: Negative.   Genitourinary: Negative.   Musculoskeletal: Negative.   Psychiatric/Behavioral: Negative.       Physical Exam Triage Vital Signs ED Triage Vitals [10/24/23 1154]  Encounter Vitals Group     BP 139/74     Systolic BP Percentile      Diastolic BP Percentile      Pulse Rate 87     Resp 17     Temp 98.1 F (36.7 C)     Temp Source Oral     SpO2 95 %     Weight      Height      Head Circumference      Peak Flow      Pain Score 5     Pain Loc      Pain Education      Exclude from Growth Chart    No data found.  Updated Vital Signs BP 139/74 (BP Location: Right Arm)   Pulse 87   Temp 98.1 F (36.7 C) (Oral)   Resp 17   LMP 01/07/2020   SpO2 95%   Visual Acuity Right Eye Distance:   Left Eye Distance:   Bilateral Distance:    Right Eye Near:   Left Eye Near:    Bilateral Near:     Physical Exam Constitutional:      General: She is not in acute distress.    Appearance: Normal appearance. She is normal weight. She is not ill-appearing or toxic-appearing.  HENT:     Nose: Congestion and rhinorrhea present.     Mouth/Throat:     Mouth: Mucous membranes are moist.     Pharynx: No posterior oropharyngeal erythema.  Cardiovascular:     Rate and Rhythm: Normal rate and regular rhythm.  Pulmonary:     Effort: Pulmonary effort is normal.     Breath sounds: Normal breath sounds. No wheezing or rhonchi.  Musculoskeletal:     Cervical back: Normal range of motion and neck supple.  Skin:    General: Skin is warm.  Neurological:     General: No focal deficit present.     Mental Status: She is alert.  Psychiatric:        Mood and Affect: Mood normal.      UC Treatments / Results  Labs (all labs ordered are listed, but only abnormal results are displayed) Labs Reviewed - No data to display  EKG   Radiology No results  found.  Procedures Procedures (including critical care time)  Medications Ordered in UC Medications - No data to display  Initial Impression / Assessment  and Plan / UC Course  I have reviewed the triage vital signs and the nursing notes.  Pertinent labs & imaging results that were available during my care of the patient were reviewed by me and considered in my medical decision making (see chart for details).  Final Clinical Impressions(s) / UC Diagnoses   Final diagnoses:  Acute bronchitis, unspecified organism  Acute non-recurrent frontal sinusitis     Discharge Instructions      You were seen today for upper respiratory symptoms.  I have sent out an antibiotic today and cough syrup.  You may use tylenol for headaches.  Please get plenty of rest and fluids.  You may return if not improving.     ED Prescriptions     Medication Sig Dispense Auth. Provider   azithromycin (ZITHROMAX Z-PAK) 250 MG tablet Take as directed 6 tablet Adaiah Morken, MD   chlorpheniramine-HYDROcodone (TUSSIONEX) 10-8 MG/5ML Take 5 mLs by mouth at bedtime as needed for cough. 25 mL Jannifer Franklin, MD      PDMP not reviewed this encounter.   Jannifer Franklin, MD 10/24/23 [phone]

## 2023-10-24 NOTE — ED Triage Notes (Signed)
 Pt present with cough, chest discomfort, fatigue, headache for 1 week

## 2023-10-24 NOTE — Discharge Instructions (Signed)
 You were seen today for upper respiratory symptoms.  I have sent out an antibiotic today and cough syrup.  You may use tylenol for headaches.  Please get plenty of rest and fluids.  You may return if not improving.

## 2024-01-18 ENCOUNTER — Inpatient Hospital Stay: Attending: Hematology and Oncology | Admitting: Hematology and Oncology

## 2024-01-18 ENCOUNTER — Inpatient Hospital Stay

## 2024-01-18 VITALS — BP 134/64 | HR 81 | Temp 98.4°F | Resp 18 | Ht 62.0 in | Wt 264.0 lb

## 2024-01-18 DIAGNOSIS — E119 Type 2 diabetes mellitus without complications: Secondary | ICD-10-CM | POA: Insufficient documentation

## 2024-01-18 DIAGNOSIS — Z79899 Other long term (current) drug therapy: Secondary | ICD-10-CM | POA: Diagnosis not present

## 2024-01-18 DIAGNOSIS — R200 Anesthesia of skin: Secondary | ICD-10-CM | POA: Insufficient documentation

## 2024-01-18 DIAGNOSIS — D72825 Bandemia: Secondary | ICD-10-CM

## 2024-01-18 DIAGNOSIS — Z87891 Personal history of nicotine dependence: Secondary | ICD-10-CM | POA: Diagnosis not present

## 2024-01-18 DIAGNOSIS — Z7984 Long term (current) use of oral hypoglycemic drugs: Secondary | ICD-10-CM | POA: Insufficient documentation

## 2024-01-18 DIAGNOSIS — D72829 Elevated white blood cell count, unspecified: Secondary | ICD-10-CM | POA: Diagnosis present

## 2024-01-18 DIAGNOSIS — Z7985 Long-term (current) use of injectable non-insulin antidiabetic drugs: Secondary | ICD-10-CM | POA: Diagnosis not present

## 2024-01-18 DIAGNOSIS — R700 Elevated erythrocyte sedimentation rate: Secondary | ICD-10-CM | POA: Insufficient documentation

## 2024-01-18 DIAGNOSIS — R7989 Other specified abnormal findings of blood chemistry: Secondary | ICD-10-CM | POA: Insufficient documentation

## 2024-01-18 DIAGNOSIS — I10 Essential (primary) hypertension: Secondary | ICD-10-CM | POA: Insufficient documentation

## 2024-01-18 LAB — CMP (CANCER CENTER ONLY)
ALT: 43 U/L (ref 0–44)
AST: 36 U/L (ref 15–41)
Albumin: 4.2 g/dL (ref 3.5–5.0)
Alkaline Phosphatase: 77 U/L (ref 38–126)
Anion gap: 8 (ref 5–15)
BUN: 15 mg/dL (ref 6–20)
CO2: 30 mmol/L (ref 22–32)
Calcium: 9.9 mg/dL (ref 8.9–10.3)
Chloride: 102 mmol/L (ref 98–111)
Creatinine: 0.68 mg/dL (ref 0.44–1.00)
GFR, Estimated: >60 mL/min (ref >60)
Glucose, Bld: 117 mg/dL — ABNORMAL HIGH (ref 70–99)
Potassium: 3.5 mmol/L (ref 3.5–5.1)
Sodium: 140 mmol/L (ref 135–145)
Total Bilirubin: 0.5 mg/dL (ref 0.0–1.2)
Total Protein: 7.6 g/dL (ref 6.5–8.1)

## 2024-01-18 LAB — CBC WITH DIFFERENTIAL (CANCER CENTER ONLY)
Abs Immature Granulocytes: 0.1 10*3/uL — ABNORMAL HIGH (ref 0.00–0.07)
Basophils Absolute: 0.1 10*3/uL (ref 0.0–0.1)
Basophils Relative: 0 %
Eosinophils Absolute: 0.2 10*3/uL (ref 0.0–0.5)
Eosinophils Relative: 2 %
HCT: 38.6 % (ref 36.0–46.0)
Hemoglobin: 12.7 g/dL (ref 12.0–15.0)
Immature Granulocytes: 1 %
Lymphocytes Relative: 13 %
Lymphs Abs: 1.8 10*3/uL (ref 0.7–4.0)
MCH: 28.3 pg (ref 26.0–34.0)
MCHC: 32.9 g/dL (ref 30.0–36.0)
MCV: 86 fL (ref 80.0–100.0)
Monocytes Absolute: 0.6 10*3/uL (ref 0.1–1.0)
Monocytes Relative: 4 %
Neutro Abs: 11.1 10*3/uL — ABNORMAL HIGH (ref 1.7–7.7)
Neutrophils Relative %: 80 %
Platelet Count: 304 10*3/uL (ref 150–400)
RBC: 4.49 MIL/uL (ref 3.87–5.11)
RDW: 14.9 % (ref 11.5–15.5)
WBC Count: 13.9 10*3/uL — ABNORMAL HIGH (ref 4.0–10.5)
nRBC: 0 % (ref 0.0–0.2)

## 2024-01-18 LAB — SEDIMENTATION RATE: Sed Rate: 50 mm/h — ABNORMAL HIGH (ref 0–22)

## 2024-01-18 LAB — LACTATE DEHYDROGENASE: LDH: 141 U/L (ref 98–192)

## 2024-01-18 LAB — C-REACTIVE PROTEIN: CRP: 3.2 mg/dL — ABNORMAL HIGH (ref <1.0)

## 2024-01-18 NOTE — Progress Notes (Signed)
 [facility] [phone]   [phone]  INITIAL CONSULT NOTE  Patient Care Team: Celestia Rosaline SQUIBB, NP as PCP - General (Internal Medicine)  Hematological/Oncological History # Leukocytosis, Neutrophil Predominate 02/03/2020: White blood cell 12.5, hemoglobin 12.1, MCV 83.3, platelets 352.  Neutrophilic predominant 12/18/2023: White blood cell 13.9, hemoglobin 13.2, MCV 89, platelets 348.  01/18/2024: establish care with Dr. Federico   CHIEF COMPLAINTS/PURPOSE OF CONSULTATION:  Leukocytosis   HISTORY OF PRESENTING ILLNESS:  Rita Ochoa 53 y.o. female with medical history significant for DM type II and HTN who presents for evaluation for leukocytosis.   On review of the previous records Rita Ochoa has a history of leukocytosis dating back to at least February 03, 2020.  At that time she had a White blood cell 12.5, hemoglobin 12.1, MCV 83.3, platelets 352.  Neutrophilic predominant.  More recently she had labs which confirmed elevated white blood cell count with neutrophilic predominance.  Due to concern of these findings the patient was referred to hematology for further evaluation and management.   On exam today [facility] presents for evaluation of leukocytosis.  She reports that she is not sure how long this has been going on for her.  She reports that she does not have any history of inflammatory disorders such as RA or lupus.  She does not have any rash or psoriasis.  She reports that she does have some numbness in the toes on her left foot.  She reports that she does not have any issues with shortness of breath, dizziness, or lightheadedness.  She reports she has no nausea or vomiting.  She reports that she has otherwise recently been at her baseline level of health.  On further discussion she reports that she does have some snoring when she sleeps.  She reports that she does feel tired and some daytime somnolence.  She notes that she does not smoke,  vape, or other smoking items.  She reports that she rarely drinks alcohol.  Her family history is remarkable for her mother having a bad heart and her father having type 2 diabetes.  She had a maternal aunt maternal uncle with cancer but she is unsure what kind.  She has 4 healthy children.  She otherwise denies any current fevers, chills, sweats, nausea, vomiting or diarrhea.  A full 10 point ROS is otherwise negative.  MEDICAL HISTORY:  Past Medical History:  Diagnosis Date   Diabetes mellitus without complication (HCC)    Hypertension     SURGICAL HISTORY: No past surgical history on file.  SOCIAL HISTORY: Social History   Socioeconomic History   Marital status: Divorced    Spouse name: Not on file   Number of children: Not on file   Years of education: Not on file   Highest education level: Not on file  Occupational History   Not on file  Tobacco Use   Smoking status: Former    Current packs/day: 0.00    Types: Cigarettes    Quit date: 08/01/2014    Years since quitting: 9.4   Smokeless tobacco: Never  Vaping Use   Vaping status: Never Used  Substance and Sexual Activity   Alcohol use: No   Drug use: No   Sexual activity: Not on file  Other Topics Concern   Not on file  Social History Narrative   Not on file   Social Drivers of Health   Financial Resource Strain: Not on File (11/18/2021)   Received from [facility]  Dance movement psychotherapist Strain: 0  Food Insecurity: No Food Insecurity (01/18/2024)   Hunger Vital Sign    Worried About Running Out of Food in the Last Year: Never true    Ran Out of Food in the Last Year: Never true  Transportation Needs: No Transportation Needs (01/18/2024)   PRAPARE - Administrator, Civil Service (Medical): No    Lack of Transportation (Non-Medical): No  Physical Activity: Not on File (11/18/2021)   Received from [facility]   Physical Activity    Physical Activity: 0  Stress: Not on File (11/18/2021)    Received from [facility]   Stress    Stress: 0  Social Connections: Not on File (04/27/2023)   Received from [facility]   Social Connections    Connectedness: 0  Intimate Partner Violence: Not At Risk (01/18/2024)   Humiliation, Afraid, Rape, and Kick questionnaire    Fear of Current or Ex-Partner: No    Emotionally Abused: No    Physically Abused: No    Sexually Abused: No    FAMILY HISTORY: No family history on file.  ALLERGIES:  has no known allergies.  MEDICATIONS:  Current Outpatient Medications  Medication Sig Dispense Refill   ergocalciferol (VITAMIN D2) 1.25 MG (50000 UT) capsule Take 1 capsule by mouth once a week.     OZEMPIC, 2 MG/DOSE, 8 MG/3ML SOPN INJECT 2MG  UNDER THE SKIN EVERY WEEK     amLODipine (NORVASC) 10 MG tablet Take 10 mg by mouth daily.     losartan (COZAAR) 100 MG tablet Take 100 mg by mouth daily.     metFORMIN (GLUCOPHAGE) 1000 MG tablet Take 1,000 mg by mouth 2 (two) times daily with a meal.     metoprolol succinate (TOPROL-XL) 100 MG 24 hr tablet Take 100 mg by mouth daily. Take with or immediately following a meal.     No current facility-administered medications for this visit.    REVIEW OF SYSTEMS:   Constitutional: ( - ) fevers, ( - )  chills , ( - ) night sweats Eyes: ( - ) blurriness of vision, ( - ) double vision, ( - ) watery eyes Ears, nose, mouth, throat, and face: ( - ) mucositis, ( - ) sore throat Respiratory: ( - ) cough, ( - ) dyspnea, ( - ) wheezes Cardiovascular: ( - ) palpitation, ( - ) chest discomfort, ( - ) lower extremity swelling Gastrointestinal:  ( - ) nausea, ( - ) heartburn, ( - ) change in bowel habits Skin: ( - ) abnormal skin rashes Lymphatics: ( - ) new lymphadenopathy, ( - ) easy bruising Neurological: ( - ) numbness, ( - ) tingling, ( - ) new weaknesses Behavioral/Psych: ( - ) mood change, ( - ) new changes  All other systems were reviewed with the patient and are negative.  PHYSICAL EXAMINATION:  Vitals:   01/18/24  1307  BP: 134/64  Pulse: 81  Resp: 18  Temp: 98.4 F (36.9 C)  SpO2: 95%   Filed Weights   01/18/24 1307  Weight: 264 lb (119.7 kg)    GENERAL: well appearing middle-age Caucasian female in NAD  SKIN: skin color, texture, turgor are normal, no rashes or significant lesions EYES: conjunctiva are pink and non-injected, sclera clear LUNGS: clear to auscultation and percussion with normal breathing effort HEART: regular rate & rhythm and no murmurs and no lower extremity edema Musculoskeletal: no cyanosis of digits and no clubbing  PSYCH: alert &  oriented x 3, fluent speech NEURO: no focal motor/sensory deficits  LABORATORY DATA:  I have reviewed the data as listed    Latest Ref Rng & Units 01/18/2024    2:03 PM 02/03/2020    4:58 PM  CBC  WBC 4.0 - 10.5 K/uL 13.9  12.5   Hemoglobin 12.0 - 15.0 g/dL 87.2  87.8   Hematocrit 36.0 - 46.0 % 38.6  39.3   Platelets 150 - 400 K/uL 304  352        Latest Ref Rng & Units 01/18/2024    2:03 PM 02/03/2020    4:58 PM  CMP  Glucose 70 - 99 mg/dL 882  83   BUN 6 - 20 mg/dL 15  13   Creatinine 9.55 - 1.00 mg/dL 9.31  9.40   Sodium 864 - 145 mmol/L 140  136   Potassium 3.5 - 5.1 mmol/L 3.5  3.9   Chloride 98 - 111 mmol/L 102  98   CO2 22 - 32 mmol/L 30  27   Calcium 8.9 - 10.3 mg/dL 9.9  9.4   Total Protein 6.5 - 8.1 g/dL 7.6  7.7   Total Bilirubin 0.0 - 1.2 mg/dL 0.5  0.4   Alkaline Phos 38 - 126 U/L 77  76   AST 15 - 41 U/L 36  24   ALT 0 - 44 U/L 43  29      ASSESSMENT & PLAN Rita [facility]a 53 y.o. female with medical history significant for DM type II and HTN who presents for evaluation for leukocytosis.   After review of the labs, review of the records, and discussion with the patient the patients findings are most consistent with leukocytosis of unclear etiology.   Neutrophilic predominant leukocytosis is a common hematological finding with numerous possible etiologies. Possible causes include chronic inflammation, infection  (transient or chronic), medication side effect (particularly steroids), myeloproliferative neoplasm, or idiopathic. The most common cause of chronic inflammation causing leukocytosis is cigarette smoking. Obstructive sleep apnea can also produce a similar pattern of leukocytosis. If there is no clear cause (or if there is an increase in other cell lines) a myeloproliferative neoplasm workup should be conducted with JAK2 w/ reflex and BCR/ABL FISH.  Idiopathic cases should be observed for stability.    # Leukocytosis, Neutrophilic Predominant  --today will order CBC, CMP, and LDH  --inflammatory workup with ESR and CRP  --recommend testing for OSA  --patient is a nonsmoker --no focal signs concerning for infection.   --will proceed with MPN workup to include JAK2 w/ reflex and BCR/ABL FISH  --RTC in 6 months or sooner if indicated by above workup.     Orders Placed This Encounter  Procedures   CBC with Differential (Cancer Center Only)    Standing Status:   Future    Number of Occurrences:   1    Expiration Date:   01/17/2025   CMP (Cancer Center only)    Standing Status:   Future    Number of Occurrences:   1    Expiration Date:   01/17/2025   Lactate dehydrogenase (LDH)    Standing Status:   Future    Number of Occurrences:   1    Expiration Date:   01/17/2025   Sedimentation rate    Standing Status:   Future    Number of Occurrences:   1    Expiration Date:   01/17/2025   C-reactive protein    Standing Status:  Future    Number of Occurrences:   1    Expiration Date:   01/17/2025   BCR-ABL1 FISH    Standing Status:   Future    Number of Occurrences:   1    Expiration Date:   01/17/2025   JAK2 V617 reflex CALR/MPL/E12-15    Standing Status:   Future    Number of Occurrences:   1    Expiration Date:   01/17/2025   Ambulatory referral to Neurology    Referral Priority:   Routine    Referral Type:   Consultation    Referral Reason:   Specialty Services Required    Referred to  Provider:   Chalice Saunas, MD    Requested Specialty:   Neurology    Number of Visits Requested:   1    All questions were answered. The patient knows to call the clinic with any problems, questions or concerns.  A total of more than 60 minutes were spent on this encounter with face-to-face time and non-face-to-face time, including preparing to see the patient, ordering tests and/or medications, counseling the patient and coordination of care as outlined above.   Norleen IVAR Kidney, MD Department of Hematology/Oncology [facility] at [facility] Phone: [phone] Pager: [phone] Email: [email or web address]  01/28/2024 3:44 PM

## 2024-01-23 LAB — JAK2 V617F RFX CALR/MPL/E12-15

## 2024-01-23 LAB — CALR +MPL + E12-E15  (REFLEX)

## 2024-01-25 LAB — BCR-ABL1 FISH
Cells Analyzed: 200
Cells Counted: 200

## 2024-02-06 ENCOUNTER — Ambulatory Visit: Payer: Self-pay | Admitting: *Deleted

## 2024-02-06 NOTE — Telephone Encounter (Addendum)
 Contacted patient per MD request. Reviewed Dr. Lafonda notes with patient. Encouraged her to contact this office if she has not been contacted by Dr. Lionell office within next 3-4 weeks to schedule sleep study.  Patient verbalized understanding of all information.     ----- Message from Nurse Almarie T sent at 02/05/2024  4:24 PM EDT -----  ----- Message ----- From: Federico Norleen ONEIDA MADISON, MD Sent: 01/28/2024   3:45 PM EDT To: Almarie DELENA Arabia, RN  Please let Mrs. Aure know that her blood work did not show any evidence of mutation causing her high white blood cell count.  She did have increased inflammatory markers.  We have put in a request  for a sleep study to be performed.  Pending the results of this sleep study we could also consider a referral to rheumatology if the sleep study is negative or if sleep apnea treatment fails to  improve the white blood cell count.  ----- Message ----- From: Interface, Lab In [place] Sent: 01/18/2024   2:24 PM EDT To: Norleen ONEIDA Federico MADISON, MD

## 2024-03-11 ENCOUNTER — Encounter: Payer: Self-pay | Admitting: Neurology

## 2024-03-11 ENCOUNTER — Ambulatory Visit: Admitting: Neurology

## 2024-03-11 VITALS — BP 150/90 | HR 80 | Ht 62.0 in | Wt 265.0 lb

## 2024-03-11 DIAGNOSIS — N951 Menopausal and female climacteric states: Secondary | ICD-10-CM | POA: Diagnosis not present

## 2024-03-11 DIAGNOSIS — G478 Other sleep disorders: Secondary | ICD-10-CM | POA: Insufficient documentation

## 2024-03-11 DIAGNOSIS — R0683 Snoring: Secondary | ICD-10-CM | POA: Insufficient documentation

## 2024-03-11 DIAGNOSIS — E66813 Obesity, class 3: Secondary | ICD-10-CM | POA: Diagnosis not present

## 2024-03-11 DIAGNOSIS — Z6841 Body Mass Index (BMI) 40.0 and over, adult: Secondary | ICD-10-CM | POA: Diagnosis not present

## 2024-03-11 MED ORDER — ALPHA LIPOIC ACID-BIOTIN 300-333 MG-MCG PO CAPS: ORAL_CAPSULE | ORAL | Status: AC

## 2024-03-11 NOTE — Patient Instructions (Addendum)
 ASSESSMENT AND PLAN 53 y.o. year old female  here with:   Leucocytosis (?) WBC 13. 9 , elevated c reactive protein, neutrophilia,   referral from hematoloy Dr Federico MOULD, MD    Diabetic patient with obesity on metformin Ozempic       1) hyper-hydrosis, nocturia and non restorative sleep  in perimenopause.    2) obesity, large neck and small airway, BMI  48.5 .  Reportedly snoring.    3) suspected neuropathy in both feet.    Plan :   I will offer this patient a sleep evaluation by a home sleep test, my first goal is to screen her for obstructive sleep apnea and to see if she has hypoxia or not.  Usually responds over hypoxic patient is to produce more red blood cells not white blood cells.  However she does not have restorative and qualitative sleep, she feels extremely fatigued, and she has all the risk factors for obstructive apnea as listed above.   She also reports pins-and-needles dysesthesias in the bottom of both feet and between the toes and I would certainly want her to take some alpha lipoic fatty acid supplement for this condition which I presume is diabetic neuropathy.  I think the patient could be well served with Ozempic-Wegovy considering her body mass index.  If we should find that the patient has sleep apnea she may actually qualify for Zepbound which is a substance similar to Mounjaro and is usually easier and better tolerated.     I plan to follow up either personally or through our NP within 4 months.    I would like to thank Francois Dines, FNP and Federico Norleen ONEIDA [place], Md [address] [place],  [place] [address] for allowing me to meet with and to take care of this pleasant patient.    Living With Sleep Apnea Sleep apnea is a condition that affects your breathing while you're sleeping. Your tongue or the tissue in your throat may block the flow of air while you sleep. You may have shallow breathing or stop breathing for short periods of time. The breaks in breathing  interrupt the deep sleep that you need to feel rested. Even if you don't wake up from the gaps in breathing, you may feel tired during the day. People with sleep apnea may snore loudly. You may have a headache in the morning and feel anxious or depressed. How can sleep apnea affect me? Sleep apnea increases your chances of being very tired during the day. This is called daytime fatigue. Sleep apnea can also increase your risk of: Heart attack. Stroke. Obesity. Type 2 diabetes. Heart failure. Irregular heartbeat. High blood pressure. If you are very tired during the day, you may be more likely to: Not do well in school or at work. Fall asleep while driving. Have trouble paying attention. Develop depression or anxiety. Have problems having sex. This is called sexual dysfunction. What actions can I take to manage sleep apnea? Sleep apnea treatment  If you were given a device to open your airway while you sleep, use it only as told by your health care provider. You may be given: An oral appliance. This is a mouthpiece that shifts your lower jaw forward. A continuous positive airway pressure (CPAP) device. This blows air through a mask. A nasal expiratory positive airway pressure (EPAP) device. This has valves that you put into each nostril. A bi-level positive airway pressure (BIPAP) device. This blows air through a mask when  you breathe in and breathe out. You may need surgery if other treatments don't work for you. Sleep habits Go to sleep and wake up at the same time every day. This helps set your internal clock for sleeping. If you stay up later than usual on weekends, try to get up in the morning within 2 hours of the time you usually wake up. Try to get at least 7-9 hours of sleep each night. Stop using a computer, tablet, and mobile phone a few hours before bedtime. Do not take long naps during the day. If you nap, limit it to 30 minutes. Have a relaxing bedtime routine. Reading or  listening to music may relax you and help you sleep. Use your bedroom only for sleep. Keep your television and computer out of your bedroom. Keep your bedroom cool, dark, and quiet. Use a supportive mattress and pillows. Follow your provider's instructions for other changes to sleep habits. Nutrition Do not eat big meals in the evening. Do not have caffeine in the later part of the day. The effects of caffeine can last for more than 5 hours. Follow your provider's instructions for any changes to what you eat and drink. Lifestyle Do not drink alcohol before bedtime. Alcohol can cause you to fall asleep at first, but then it can cause you to wake up in the middle of the night and have trouble getting back to sleep. Do not smoke, vape, or use nicotine or tobacco. Medicines Take over-the-counter and prescription medicines only as told by your provider. Do not use over-the-counter sleep medicine. You may become dependent on this medicine, and it can make sleep apnea worse. Do not take medicines, such as sedatives and narcotics, unless told to by your provider. Activity Exercise on most days, but avoid exercising in the evening. Exercising near bedtime can interfere with sleeping. If possible, spend time outside every day. Natural light helps with your internal clock. General information Lose weight if you need to. Stay at a healthy weight. If you are having surgery, make sure to tell your provider that you have sleep apnea. You may need to bring your device with you. Keep all follow-up visits. Your provider will want to check on your condition. Where to find more information National Heart, Lung, and Blood Institute: BuffaloDryCleaner.gl This information is not intended to replace advice given to you by your health care provider. Make sure you discuss any questions you have with your health care provider. Document Revised: 11/09/2022 Document Reviewed: 11/09/2022 Elsevier Patient Education  2024  Elsevier Inc.   Quality Sleep Information, Adult Quality sleep is important for your mental and physical health. It also improves your quality of life. Quality sleep means you: Are asleep for most of the time you are in bed. Fall asleep within 30 minutes. Wake up no more than once a night. Are awake for no longer than 20 minutes if you do wake up during the night. Most adults need 7-8 hours of quality sleep each night. How can poor sleep affect me? If you do not get enough quality sleep, you may have: Mood swings. Daytime sleepiness. Decreased alertness, reaction time, and concentration. Sleep disorders, such as insomnia and sleep apnea. Difficulty with: Solving problems. Coping with stress. Paying attention. These issues may affect your performance and productivity at work, school, and home. Lack of sleep may also put you at higher risk for accidents, suicide, and risky behaviors. If you do not get quality sleep, you may also be at higher  risk for several health problems, including: Infections. Type 2 diabetes. Heart disease. High blood pressure. Obesity. Worsening of long-term conditions, like arthritis, kidney disease, depression, Parkinson's disease, and epilepsy. What actions can I take to get more quality sleep? Sleep schedule and routine Stick to a sleep schedule. Go to sleep and wake up at about the same time each day. Do not try to sleep less on weekdays and make up for lost sleep on weekends. This does not work. Limit naps during the day to 30 minutes or less. Do not take naps in the late afternoon. Make time to relax before bed. Reading, listening to music, or taking a hot bath promotes quality sleep. Make your bedroom a place that promotes quality sleep. Keep your bedroom dark, quiet, and at a comfortable room temperature. Make sure your bed is comfortable. Avoid using electronic devices that give off bright blue light for 30 minutes before bedtime. Your brain perceives  bright blue light as sunlight. This includes television, phones, and computers. If you are lying awake in bed for longer than 20 minutes, get up and do a relaxing activity until you feel sleepy. Lifestyle     Try to get at least 30 minutes of exercise on most days. Do not exercise 2-3 hours before going to bed. Do not use any products that contain nicotine or tobacco. These products include cigarettes, chewing tobacco, and vaping devices, such as e-cigarettes. If you need help quitting, ask your health care provider. Do not drink caffeinated beverages for at least 8 hours before going to bed. Coffee, tea, and some sodas contain caffeine. Do not drink alcohol or eat large meals close to bedtime. Try to get at least 30 minutes of sunlight every day. Morning sunlight is best. Medical concerns Work with your health care provider to treat medical conditions that may affect sleeping, such as: Nasal obstruction. Snoring. Sleep apnea and other sleep disorders. Talk to your health care provider if you think any of your prescription medicines may cause you to have difficulty falling or staying asleep. If you have sleep problems, talk with a sleep consultant. If you think you have a sleep disorder, talk with your health care provider about getting evaluated by a specialist. Where to find more information Sleep Foundation: sleepfoundation.org American Academy of Sleep Medicine: aasm.org Centers for Disease Control and Prevention (CDC): TonerPromos.no Contact a health care provider if: You have trouble getting to sleep or staying asleep. You often wake up very early in the morning and cannot get back to sleep. You have daytime sleepiness. You have daytime sleep attacks of suddenly falling asleep and sudden muscle weakness (narcolepsy). You have a tingling sensation in your legs with a strong urge to move your legs (restless legs syndrome). You stop breathing briefly during sleep (sleep apnea). You think you  have a sleep disorder or are taking a medicine that is affecting your quality of sleep. Summary Most adults need 7-8 hours of quality sleep each night. Getting enough quality sleep is important for your mental and physical health. Make your bedroom a place that promotes quality sleep, and avoid things that may cause you to have poor sleep, such as alcohol, caffeine, smoking, or large meals. Talk to your health care provider if you have trouble falling asleep or staying asleep. This information is not intended to replace advice given to you by your health care provider. Make sure you discuss any questions you have with your health care provider. Document Revised: 11/10/2021 Document Reviewed: 11/10/2021  Elsevier Patient Education  2024 [employer].

## 2024-03-11 NOTE — Progress Notes (Addendum)
 SLEEP MEDICINE CLINIC    Provider:  Dedra Gores, MD  Primary Care Physician:  Francois Dines, FNP [address] [place] [address]     Referring Provider: Federico Norleen ONEIDA [place], Md [address] [place],  [place] [address]          Chief Complaint according to patient   Patient presents with:     New Patient (Initial Visit)     reports she gets about 7-8 hrs of broken sleep a night. She gets up frequently to use the restroom. She has a lot of daytime fatigue. No known apnea events. No known family history of sleep disorders. Occasional trouble with falling sleep, more with staying asleep.   DM, Nocturia,peri- menopausal hot flushes,        HISTORY OF PRESENT ILLNESS:  ZAKYRIA METZINGER is a 53 y.o. 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     Family medical /sleep history: no other family member with OSA, insomnia, sleep walkers.    Social history:  Patient is working as a Product manager ( private clients )  and lives in a household with husband  and  the last of 4 children at home  a son age 31.  11 grandchildren.   The patient currently works with her daughter-.  Pets are not present. Tobacco use: none  1 quit in 2016 .  ETOH use ; none ,  Caffeine intake in form of Coffee( no) Soda( yes ) Tea ( yes) no energy drinks Exercise in form of - physical job.       Sleep habits are as follows: The patient's dinner time is between 5-6 PM. The patient goes to bed at 10 PM and continues to sleep for 7-8 hours, wakes for many  bathroom breaks, the first time at 2 AM.   The preferred sleep position is laterally, with the support of 2 pillows. Dreams are reportedly frequent/vivid.   The patient wakes up  with multiple alarms.   A7.30 M is the usual rise time. She reports not feeling refreshed or restored in AM, with symptoms such as dry mouth, morning headaches, and residual fatigue. Naps are taken infrequently, lasting from 10 to 20 minutes and are more refreshing.    Review of Systems: Out of a complete 14 system review, the patient complains of only the following symptoms, and all other reviewed systems are negative.:  Fatigue, sleepiness , snoring,Nocturia    How likely are you to doze in the following situations: 0 = not likely, 1 = slight chance, 2 = moderate chance, 3 = high chance   Sitting and Reading? Watching Television? Sitting inactive in a public place (theater or meeting)? As a passenger in a car for an hour without a break? Lying down in the afternoon when circumstances permit? Sitting and talking to someone? Sitting quietly after lunch without alcohol? In a car, while stopped for a few minutes in traffic?   Total = 8-/ 24 points  FSS endorsed at 53/ 63 points.    Social History   Socioeconomic History   Marital status: Divorced    Spouse name: Not on file   Number of children: 4   Years of education: Not on file   Highest education level: Not on file  Occupational History   Not on file  Tobacco Use   Smoking status: Former    Current packs/day: 0.00    Types: Cigarettes    Quit date: 08/01/2014    Years since quitting: 9.6   Smokeless tobacco: Never  Vaping Use   Vaping status: Never Used  Substance and Sexual Activity   Alcohol use: No   Drug use: No   Sexual activity: Not on file  Other Topics Concern   Not on file  Social History Narrative   Not on file   Social Drivers of Health   Financial Resource Strain: Not on File (11/18/2021)   Received from [facility]    Financial Resource Strain: 0  Food Insecurity: No Food Insecurity (01/18/2024)   Hunger Vital Sign    Worried About Running Out of Food in the Last Year: Never true    Ran Out of Food in the Last Year: Never true  Transportation Needs: No Transportation Needs (01/18/2024)   PRAPARE - Administrator, Civil Service (Medical): No    Lack of Transportation (Non-Medical): No  Physical Activity: Not on File (11/18/2021)   Received from [facility]   Physical Activity    Physical Activity: 0  Stress: Not on File (11/18/2021)   Received from [facility]   Stress    Stress: 0  Social Connections: Not on File (04/27/2023)   Received from [facility]   Social Connections    Connectedness: 0    History reviewed. No pertinent family history.  Past Medical History:  Diagnosis Date   Diabetes mellitus without complication (HCC)    Hypertension     History reviewed. No pertinent surgical history.   Current Outpatient Medications on File Prior to Visit  Medication Sig Dispense Refill   amLODipine (NORVASC) 10 MG tablet Take 10 mg by mouth daily.     ergocalciferol (VITAMIN D2) 1.25 MG (50000 UT) capsule Take 1 capsule by mouth once a week.     gabapentin (NEURONTIN) 300 MG capsule Take 300 mg by mouth at bedtime.     losartan (COZAAR) 100 MG tablet Take 100 mg by mouth daily.     metFORMIN (GLUCOPHAGE) 1000 MG tablet Take 1,000 mg by mouth 2 (two) times daily with a meal.     metoprolol succinate (TOPROL-XL) 100 MG 24 hr tablet Take 100 mg by mouth daily. Take with or immediately following a meal.     OZEMPIC, 2 MG/DOSE, 8 MG/3ML SOPN INJECT 2MG  UNDER THE SKIN EVERY WEEK     No current facility-administered medications on file prior to visit.    No Known Allergies   DIAGNOSTIC DATA (LABS, IMAGING, TESTING) - I reviewed patient records, labs, notes, testing and imaging myself where available.  Lab Results  Component Value Date   WBC 13.9 (H) 01/18/2024   HGB 12.7  01/18/2024   HCT 38.6 01/18/2024   MCV 86.0 01/18/2024   PLT 304 01/18/2024      Component Value Date/Time   NA 140 01/18/2024 1403   K 3.5 01/18/2024 1403   CL 102 01/18/2024 1403   CO2 30 01/18/2024 1403   GLUCOSE 117 (H) 01/18/2024 1403  BUN 15 01/18/2024 1403   CREATININE 0.68 01/18/2024 1403   CALCIUM 9.9 01/18/2024 1403   PROT 7.6 01/18/2024 1403   ALBUMIN 4.2 01/18/2024 1403   AST 36 01/18/2024 1403   ALT 43 01/18/2024 1403   ALKPHOS 77 01/18/2024 1403   BILITOT 0.5 01/18/2024 1403   GFRNONAA >60 01/18/2024 1403   GFRAA >60 02/03/2020 1658   No results found for: CHOL, HDL, LDLCALC, LDLDIRECT, TRIG, CHOLHDL No results found for: YHAJ8R No results found for: VITAMINB12 No results found for: TSH  PHYSICAL EXAM:  Today's Vitals   03/11/24 1508  BP: (!) 150/90  Pulse: 80  Weight: 265 lb (120.2 kg)  Height: 5' 2 (1.575 m)   Body mass index is 48.47 kg/m.   Wt Readings from Last 3 Encounters:  03/11/24 265 lb (120.2 kg)  01/18/24 264 lb (119.7 kg)  02/03/20 250 lb (113.4 kg)     Ht Readings from Last 3 Encounters:  03/11/24 5' 2 (1.575 m)  01/18/24 5' 2 (1.575 m)  02/03/20 5' 1 (1.549 m)      General: The patient is awake, alert and appears not in acute distress. The patient is well groomed. Head: Normocephalic, atraumatic. Neck is supple.   Mallampati 2,  neck circumference:19.5  inches . Nasal airflow is patent.   crowded dental status  is seen.  Dental status: upper dentures  Cardiovascular:  Regular rate and cardiac rhythm by pulse,  without distended neck veins. Respiratory: Lungs are clear to auscultation.  Skin:  Without evidence of ankle edema, or rash. Trunk: The patient's posture is erect.   NEUROLOGIC EXAM: The patient is awake and alert, oriented to place and time.   Memory subjective described as intact.  Attention span & concentration ability appears normal.  Speech is fluent,   without dysarthria, dysphonia  or aphasia.  Mood and affect are appropriate.   Cranial nerves: no loss of smell or taste reported  Pupils are equal and briskly reactive to light. Funduscopic exam deferred. .  Extraocular movements in vertical and horizontal planes were intact and without nystagmus. No Diplopia. Visual fields by finger perimetry are intact.  Hearing was intact to soft voice and finger rubbing.    Facial sensation intact to fine touch.   Facial motor strength is symmetric and tongue and uvula move midline.  Neck ROM : rotation, tilt and flexion extension were normal for age and shoulder shrug was symmetrical.    Motor exam:  Symmetric bulk, tone and ROM.   Normal tone without cog- wheeling, symmetric grip strength .   Sensory:  Vibration is felt in both ankles.  Proprioception tested in the upper extremities was normal.   Coordination: Rapid alternating movements in the fingers/hands were of normal speed.  The Finger-to-nose maneuver was intact without evidence of ataxia, dysmetria or tremor.   Gait and station: Patient could rise unassisted from a seated position, walked without assistive device.  Stance is of normal width/ base and the patient turned with 3 steps.  Toe and heel walk were deferred.  Deep tendon reflexes: in the  upper and lower extremities are symmetric and intact.  Babinski response was deferred .    ASSESSMENT AND PLAN 53 y.o. year old female  here with:  Leucocytosis (?) WBC 13. 9 , Edwards, Rosaline SQUIBB, NP as PCP - General (Internal Medicine)   Hematological/Oncological History # Leukocytosis, Neutrophil Predominate 02/03/2020: White blood cell 12.5, hemoglobin 12.1, MCV 83.3, platelets 352.  Neutrophilic predominant 12/18/2023: White blood  cell 13.9, hemoglobin 13.2, MCV 89, platelets 348.  01/18/2024: establish care with Dr. Federico   elevated c reactive protein, neutrophilia,   referral from [facility] Dr Federico MOULD, MD   Diabetic patient with obesity on metformin Has  started Ozempic but feels its not working well for weight loss for her      1) hyper-hydrosis, nocturia and non- restorative sleep  in perimenopause.   2) high risk for OSA:  obesity, large neck for female and small airway, BMI  48.5 .  Reportedly snoring.   3) suspected neuropathy in both feet.- likely DM related      Plan :   I will offer this patient a sleep evaluation by a home sleep test, my first goal is to screen her for obstructive sleep apnea and to see if she has hypoxia or not.  Usually responds over hypoxic patient is to produce more red blood cells not white blood cells.  However she does not have restorative and qualitative sleep, she feels extremely fatigued, and she has all the risk factors for obstructive apnea as listed above.  She also reports pins-and-needles dysesthesias in the bottom of both feet and between the toes and I would certainly want her to take some alpha lipoic fatty acid supplement for this condition which I presume is diabetic neuropathy.  I think the patient could be well served with Ozempic-Wegovy considering her body mass index.  If we should find that the patient has sleep apnea she may actually qualify for Zepbound which is a substance similar to Mounjaro and is usually easier and better tolerated.   I plan to follow up either personally or through our NP within 4 months.   I would like to thank Francois Dines, FNP and Federico Norleen ONEIDA [place], Md [address] [place],  [place] [address] for allowing me to meet with and to take care of this pleasant patient.   Discussion of sleep hygiene setting bedtime and rise time,  hot shower  before bed time, no screen light in the bedroom, the bedroom should be cool, quiet and dark. Night lights should illuminate the floor not shine into your eyes. Golden glow  light is less intrusive than blue or cold light.  Read in a book with pages, not on a device. Consider audio books and soothing sound -scapes.    After  spending a total time of  45  minutes face to face and additional time for physical and neurologic examination, review of laboratory studies,  personal review of imaging studies, reports and results of other testing and review of referral information / records as far as provided in visit,   Electronically signed by: Dedra Gores, MD 03/11/2024 3:44 PM  [facility] Board certified by The [employer] of Sleep Medicine and Diplomate of the [employer] of Sleep Medicine. Board certified In Neurology through the ABPN, Fellow of the [employer] of Neurology.

## 2024-04-09 ENCOUNTER — Encounter

## 2024-04-30 ENCOUNTER — Ambulatory Visit (INDEPENDENT_AMBULATORY_CARE_PROVIDER_SITE_OTHER): Admitting: Neurology

## 2024-04-30 DIAGNOSIS — R0683 Snoring: Secondary | ICD-10-CM

## 2024-04-30 DIAGNOSIS — G478 Other sleep disorders: Secondary | ICD-10-CM

## 2024-04-30 DIAGNOSIS — G4733 Obstructive sleep apnea (adult) (pediatric): Secondary | ICD-10-CM | POA: Diagnosis not present

## 2024-04-30 DIAGNOSIS — Z6841 Body Mass Index (BMI) 40.0 and over, adult: Secondary | ICD-10-CM

## 2024-04-30 DIAGNOSIS — E66813 Obesity, class 3: Secondary | ICD-10-CM

## 2024-04-30 DIAGNOSIS — R7303 Prediabetes: Secondary | ICD-10-CM

## 2024-04-30 DIAGNOSIS — N951 Menopausal and female climacteric states: Secondary | ICD-10-CM

## 2024-05-02 NOTE — Progress Notes (Signed)
 [facility]  Baker GORMAN Lek 53 year old female 03-Nov-1970   HOME SLEEP TEST REPORT ( by Watch PAT)   STUDY DATE:  04-30-2024, download on 05-02-2024 :    ORDERING CLINICIAN: Dedra Gores, MD  REFERRING CLINICIAN:    CLINICAL INFORMATION/HISTORY: Rita Ochoa is a 53 y.o. 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  Reports 1)hyper-hydrosis, nocturia and non- restorative sleep  in perimenopause.   2) has been at high risk for OSA:  obesity, large neck for female and small airway, BMI  48.5 .  Reportedly snoring.   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 Menopausal sleep problems.      Epworth sleepiness score: 8-/ 24 points   FSS endorsed at 53/ 63 points.    BMI: 48.5  kg/m   Neck Circumference: 19.5  , dentures for upper jaw.    FINDINGS:   Sleep Summary:   Total Recording Time (hours, min):   10 h 42 m     Total Sleep Time (hours, min):   9 h 3 m              Percent REM (%):  33.8%                                       Respiratory Indices:   Calculated pAHI (per AASM guideline the AHI is  18.8/h and per  CMS guideline 10.8/h                        REM pAHI:    42.4/h AASM                                             NREM pAHI:   6.5/h                            Positional AHI:    supine AHI was 15.4/h and non supine was 25/h - this unusual constellation was related to REM sleep .   Snoring:   louder in prone sleep- mean volume 42 db.  Present for 1/3 of total sleep time.                                               Oxygen Saturation Statistics:   Oxygen Saturation (%) Mean:  93%               O2 Saturation Range (%):      between 74% and 98%  O2 Saturation (minutes) <89%:   12 minutes         Pulse Rate Statistics:   Pulse Mean (bpm):     79 bpm             Pulse Range:  from 57 through 104 bpm             IMPRESSION:  This HST confirms the presence of  moderate -severe Obstructive sleep Apnea and moderate hypoxia.    RECOMMENDATION: the constellation of strongly REM sleep dependent apnea is frequently seen in obesity hypoventilation, REM sleep dependent apnea responds to weight loss.  In the meantime, CPAP therapy would be strongly recommended, given hypoxia and overall degree of apnea.  Alternative therapies to CPAP  , such as dental device (mandibular advance ment )  or  Hypoglossal nerve stimulator, are not suited for patients with incomplete dental status or overweight.   We will start with a ResMed S10 or S11 CPAP autotitration set between 6 and 16 cm water, 3 cm EPR and heated humidification, mask of patient's choice.    Any patient should be cautioned not to drive, work at heights, or operate dangerous or heavy equipment when tired or sleepy.   Review of good sleep hygiene measures is accessible to any sleep clinic patient and can be reiterated through online material- I we recommend the Guide to better Sleep   by the NIH.   Weight loss and Core Strength improvement is highly recommended for individuals with low muscle tone and/ or a BMI over 30.  Any CPAP patient should be reminded to be fully compliant with PAP therapy , (defined as using PAP therapy for more than 4 hours each night ) with the goal to improve sleep related symptoms and decrease long term cardiovascular risks. Any PAP therapy patient should be reminded, that it may take up to 3 months to get fully used to using PAP and it may take 1-2 weeks for an established  CPAP user to acclimatize to changes in pressure or mask. The earlier full compliance is achieved, the better long term compliance tends to be.   Please note that untreated obstructive sleep apnea may carry additional perioperative morbidity. Patients with significant obstructive sleep apnea should receive perioperative PAP therapy and the surgical team should be informed of the diagnosis and degree of sleep disordered breathing.  Sleep fragmentation in the presence of normal proportional sleep stages is a nonspecific findings and per se does not signify an intrinsic sleep disorder or a cause for the patient's sleep-related symptoms.  Causes include (but are not limited to) the unfamiliarity of sleeping while recorded by HST device or sleeping in a sleep lab for a full Polysomnography sleep study, but also circadian rhythm disturbances, medication side effects or an underlying mood disorder or medical problem.   The referring physician will be notified of the test results.       INTERPRETING PHYSICIAN:   Dedra Gores, MD  [facility] and [facility] Sleep Board certified by The [employer] of Sleep Medicine and Diplomate of the [employer] of Sleep Medicine. Board certified In Neurology through the ABPN, Fellow of the [employer] of Neurology.

## 2024-05-22 ENCOUNTER — Ambulatory Visit: Payer: Self-pay | Admitting: Neurology

## 2024-05-22 NOTE — Procedures (Signed)
 [facility]  Baker GORMAN Lek 53 year old female 1970/09/16   HOME SLEEP TEST REPORT ( by Watch PAT)   STUDY DATE:  04-30-2024, download on 05-02-2024 :    ORDERING CLINICIAN: Dedra Gores, MD  REFERRING CLINICIAN:    CLINICAL INFORMATION/HISTORY: Rita Ochoa is a 53 y.o. 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  Reports 1)hyper-hydrosis, nocturia and non- restorative sleep  in perimenopause.   2) has been at high risk for OSA:  obesity, large neck for female and small airway, BMI  48.5 .  Reportedly snoring.   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 Menopausal sleep problems.      Epworth sleepiness score: 8-/ 24 points   FSS endorsed at 53/ 63 points.    BMI: 48.5  kg/m   Neck Circumference: 19.5  , dentures for upper jaw.    FINDINGS:   Sleep Summary:   Total Recording Time (hours, min):   10 h 42 m     Total Sleep Time (hours, min):   9 h 3 m              Percent REM (%):  33.8%                                       Respiratory Indices:   Calculated pAHI (per AASM guideline the AHI is  18.8/h and per  CMS guideline 10.8/h                        REM pAHI:    42.4/h AASM                                             NREM pAHI:   6.5/h                            Positional AHI:    supine AHI was 15.4/h and non supine was 25/h - this unusual constellation was related to REM sleep .   Snoring:   louder in prone sleep- mean volume 42 db.  Present for 1/3 of total sleep time.                                               Oxygen Saturation Statistics:   Oxygen Saturation (%) Mean:  93%               O2 Saturation Range (%):      between 74% and 98%  O2 Saturation (minutes) <89%:   12 minutes         Pulse Rate Statistics:   Pulse Mean (bpm):     79 bpm             Pulse Range:  from 57 through 104 bpm             IMPRESSION:  This HST confirms the presence of  moderate -severe Obstructive sleep Apnea and moderate hypoxia.    RECOMMENDATION: the constellation of strongly REM sleep dependent apnea is frequently seen in obesity hypoventilation, REM sleep dependent apnea responds to weight loss.  In the meantime, CPAP therapy would be strongly recommended, given hypoxia and overall degree of apnea.  Alternative therapies to CPAP  , such as dental device (mandibular advance ment )  or  Hypoglossal nerve stimulator, are not suited for patients with incomplete dental status or overweight.   We will start with a ResMed S10 or S11 CPAP autotitration set between 6 and 16 cm water, 3 cm EPR and heated humidification, mask of patient's choice.    Any patient should be cautioned not to drive, work at heights, or operate dangerous or heavy equipment when tired or sleepy.   Review of good sleep hygiene measures is accessible to any sleep clinic patient and can be reiterated through online material- I we recommend the Guide to better Sleep   by the NIH.   Weight loss and Core Strength improvement is highly recommended for individuals with low muscle tone and/ or a BMI over 30.  Any CPAP patient should be reminded to be fully compliant with PAP therapy , (defined as using PAP therapy for more than 4 hours each night ) with the goal to improve sleep related symptoms and decrease long term cardiovascular risks. Any PAP therapy patient should be reminded, that it may take up to 3 months to get fully used to using PAP and it may take 1-2 weeks for an established CPAP user  to acclimatize to changes in pressure or mask. The earlier full compliance is achieved, the better long term compliance tends to be.   Please note that untreated obstructive sleep apnea may carry additional perioperative morbidity. Patients with significant obstructive sleep apnea should receive perioperative PAP therapy and the surgical team should be informed of the diagnosis and degree of sleep disordered breathing.  Sleep fragmentation in the presence of normal proportional sleep stages is a nonspecific findings and per se does not signify an intrinsic sleep disorder or a cause for the patient's sleep-related symptoms.  Causes include (but are not limited to) the unfamiliarity of sleeping while recorded by HST device or sleeping in a sleep lab for a full Polysomnography sleep study, but also circadian rhythm disturbances, medication side effects or an underlying mood disorder or medical problem.   The referring physician will be notified of the test results.       INTERPRETING PHYSICIAN:   Dedra Gores, MD  [facility] and [facility] Sleep Board certified by The [employer] of Sleep Medicine and Diplomate of the [employer] of Sleep Medicine. Board certified In Neurology through the ABPN, Fellow of the [employer] of Neu[employer].

## 2024-05-23 ENCOUNTER — Other Ambulatory Visit: Payer: Self-pay | Admitting: *Deleted

## 2024-05-23 DIAGNOSIS — R0683 Snoring: Secondary | ICD-10-CM

## 2024-05-23 DIAGNOSIS — G478 Other sleep disorders: Secondary | ICD-10-CM

## 2024-05-23 DIAGNOSIS — E66813 Obesity, class 3: Secondary | ICD-10-CM

## 2024-05-23 NOTE — Telephone Encounter (Signed)
 I called pt. I advised pt that Dr. Dawayne reviewed their sleep study results and found that pt has moderate to severe OSA. Dr. Chalice recommends that pt start autopap. I reviewed PAP compliance expectations with the pt. Pt is agreeable to starting an auto-PAP. I advised pt that an order will be sent to a DME, [facility], and they will call the pt within about one week after they file with the pt's insurance. They will show the pt how to use the machine, fit for masks, and troubleshoot the auto-PAP if needed. A follow up appt will be made once pt gets machine and will call us  to schedule. (Mandatory insurance complaiance appt).  Pt verbalized understanding of results. Pt had no questions at this time but was encouraged to call back if questions arise. I have sent the order to [facility] and have received confirmation that they have received the order.

## 2024-05-23 NOTE — Telephone Encounter (Signed)
-----   Message from [place] Dohmeier sent at 05/22/2024  1:59 PM EDT ----- IMPRESSION:  This HST confirms the presence of  moderate -severe Obstructive sleep Apnea and moderate hypoxia.    RECOMMENDATION: the constellation of strongly REM sleep dependent apnea is frequently seen in obesity hypoventilation, and REM sleep dependent apnea responds to weight loss in the long term . This  patient would also fulfill FDA criteria for Zepbound medication use.   In the meantime, CPAP therapy would be strongly recommended, given hypoxia and overall degree of apnea.  Alternative therapies to CPAP  , such as dental device (mandibular advance ment )  or   Hypoglossal nerve stimulator, are not suited for patients with incomplete dental status or overweight.    We will start with a ResMed S10 or S11 CPAP autotitration set between 6 and 16 cm water, 3 cm EPR and heated humidification, mask of patient's choice.    ----- Message ----- From: Chalice Saunas, MD Sent: 05/22/2024   1:56 PM EDT To: Saunas Chalice, MD

## 2024-05-28 ENCOUNTER — Telehealth: Payer: Self-pay | Admitting: Neurology

## 2024-05-28 NOTE — Telephone Encounter (Signed)
 RE: new autopap user moderate to severe Received: Today Zott, Glade Neysa Nena GORMAN, RN Got It Thank you     Previous Messages    ----- Message ----- From: Neysa Nena GORMAN, RN Sent: 05/28/2024   8:17 AM EDT To: Stacy Zott Subject: FW: new autopap user moderate to severe        Order signed thanks.  SY ----- Message ----- From: Zott, Stacy Sent: 05/23/2024   1:17 PM EDT To: Nena GORMAN Neysa, RN Subject: RE: new autopap user moderate to severe        Thank You, I will pull it once it is signed ----- Message ----- From: Neysa Nena GORMAN, RN Sent: 05/23/2024   1:04 PM EDT To: Stacy Zott Subject: FW: new autopap user moderate to severe        So sorry , I usually check behind to make sure one it there.  It is now.  Will need cosign by Dr Chalice.  Particia RN ----- Message ----- From: Zott, Stacy Sent: 05/23/2024  12:56 PM EDT To: [name]; Stacy Zott; San* Subject: RE: new autopap user moderate to severe        Hi [name] ,  I do not see an order for a PAP, please advise Thank you ----- Message ----- From: Neysa Nena GORMAN, RN Sent: 05/23/2024  10:38 AM EDT To: [name]; Stacy Zott Subject: new autopap user moderate to severe            New order in EPIC  Rita Ochoa Female, 53 y.o., 03-02-1971 MRN: [id number] Phone: [phone]   Thanks,  Particia Peak

## 2024-05-28 NOTE — Telephone Encounter (Signed)
 Please let the patient know we are not prescribing Zepbound. She is welcome to talk to her pcp or weight loss MD about it. The sleep study confirmed diagnosis of sleep apnea which would help her qualify but we do not prescribe and manage that medication at our office.

## 2024-05-28 NOTE — Telephone Encounter (Signed)
 Pt was called, the message from RN was relayed.

## 2024-05-28 NOTE — Telephone Encounter (Signed)
 Pt called wanting to know if Zepbound can be called in for her. Please advise.

## 2024-06-11 ENCOUNTER — Telehealth: Payer: Self-pay | Admitting: Neurology

## 2024-06-11 ENCOUNTER — Telehealth: Payer: Self-pay

## 2024-06-11 ENCOUNTER — Encounter: Payer: Self-pay | Admitting: Neurology

## 2024-06-11 NOTE — Telephone Encounter (Signed)
 Pt called stating that her cpap is very uncomfortable and is wanting to discuss possible other options. Please advise.

## 2024-06-11 NOTE — Telephone Encounter (Signed)
 Pt called stating that she is needing to speak to the nurse about other options besides using a cpap machine. Pt will need to discuss before scheduling Initial Cpap.

## 2024-06-11 NOTE — Telephone Encounter (Signed)
 Patients message/concerns were sent to Dr. Chalice to review

## 2024-06-11 NOTE — Telephone Encounter (Signed)
 Pt was just setup on 06/07/24. Please tell patient to call her DME, [facility], to be seen and have mask check, etc to help her with the discomfort. They have folks who can work with her one on one to help it be more comfortable. Patient also needs a 30-90 day follow-up scheduled. Please schedule her a visit with any NP or Dr Chalice between 07/08/24 and 09/08/23.

## 2024-06-11 NOTE — Telephone Encounter (Signed)
 Pt was called, a brief vm was left asking pt to schedule her initial CPAP f/u and to also advise to reach out to DME re: the discomfort

## 2024-06-18 NOTE — Telephone Encounter (Signed)
 Pt was scheduled for her initial CPAP on (08-15-2024) Pt was informed to bring machine and power cord to the appointment.   DME in pt's Snap Shot, the between dates are 08/07/24-09/07/24.

## 2024-06-18 NOTE — Telephone Encounter (Signed)
 I called pt.  I relayed I called her and spoke to her previously about her sleep results. I reiterated that cpap is what is recommended.  Dental and inspire are not due to dental and obesity.  Medication for weight loss goes thru her pcp.  Her problems and issues with mask, I deferred to her DME [facility] to see if she can get a different mask, or any other assistance relating.  I reiterated that she needs to use the machine every night 4 hours or more to meet compliance.  If not then insurance will take machine back and she will have to start the process over.  She verbalized understanding. She needs 2-3 month appt.for initial cpap.

## 2024-08-14 NOTE — Progress Notes (Unsigned)
 "   Patient: Rita Ochoa Date of Birth: 02/27/1971  Reason for Visit: Follow up History from: Patient Primary Neurologist: Dohmeier   ASSESSMENT AND PLAN 54 y.o. year old female    HISTORY OF PRESENT ILLNESS: Today 08/14/2024 HST 04/30/2024 showed moderate to severe OSA.  HISTORY  Rita Ochoa is a 54 y.o. 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     Family medical /sleep history: no other family member with OSA, insomnia, sleep walkers.    Social history:  Patient is working as a product manager ( private clients )  and lives in a household with husband  and  the last of 4 children at home  a son age 73.  11 grandchildren.    The patient currently works with her daughter-.  Pets are not present. Tobacco use: none  1 quit in 2016 .  ETOH use ; none ,  Caffeine intake in form of Coffee( no) Soda( yes ) Tea ( yes) no energy drinks Exercise in form of - physical job.        Sleep habits are as follows: The patient's dinner time is between 5-6 PM. The patient goes to bed at 10 PM and continues to sleep for 7-8 hours, wakes for many bathroom breaks, the first time at 2 AM.   The preferred sleep position is laterally, with the support of 2 pillows. Dreams are reportedly frequent/vivid.   The patient wakes up  with multiple alarms.   A7.30 M is the usual rise time. She reports not feeling refreshed or restored in AM, with symptoms such as dry mouth, morning headaches, and residual fatigue. Naps are taken infrequently, lasting from 10 to 20 minutes  and are more refreshing.  REVIEW OF SYSTEMS: Out of a complete 14 system review of symptoms, the patient complains only of the following symptoms, and all other reviewed systems are negative.  See HPI  ALLERGIES: Allergies[1]  HOME MEDICATIONS: Outpatient Medications Prior to Visit  Medication Sig Dispense Refill   Alpha Lipoic Acid -Biotin  300-333 MG-MCG CAPS Daily one time po     amLODipine (NORVASC) 10 MG tablet Take 10 mg by mouth daily.     ergocalciferol (VITAMIN D2) 1.25 MG (50000 UT) capsule Take 1 capsule by mouth once a week.     gabapentin (NEURONTIN) 300 MG capsule Take 300 mg by mouth at bedtime.     losartan (COZAAR) 100 MG tablet Take 100 mg by mouth daily.     metFORMIN (GLUCOPHAGE) 1000 MG tablet Take 1,000 mg by mouth 2 (two) times daily with a meal.     metoprolol succinate (TOPROL-XL) 100 MG 24 hr tablet Take 100 mg by mouth daily. Take with or immediately following a meal.     OZEMPIC, 2 MG/DOSE, 8 MG/3ML SOPN INJECT 2MG  UNDER THE SKIN EVERY WEEK     No facility-administered medications prior to visit.    PAST MEDICAL HISTORY: Past Medical History:  Diagnosis Date   Diabetes mellitus without complication (HCC)  Hypertension     PAST SURGICAL HISTORY: No past surgical history on file.  FAMILY HISTORY: No family history on file.  SOCIAL HISTORY: Social History   Socioeconomic History   Marital status: Divorced    Spouse name: Not on file   Number of children: Not on file   Years of education: Not on file   Highest education level: Not on file  Occupational History   Not on file  Tobacco Use   Smoking status: Former    Current packs/day: 0.00    Types: Cigarettes    Quit date: 08/01/2014    Years since quitting: 10.0   Smokeless tobacco: Never  Vaping Use   Vaping status: Never Used  Substance and Sexual Activity   Alcohol use: No   Drug use: No   Sexual activity: Not on file  Other Topics Concern   Not on file  Social History Narrative    Not on file   Social Drivers of Health   Tobacco Use: Medium Risk (03/11/2024)   Patient History    Smoking Tobacco Use: Former    Smokeless Tobacco Use: Never    Passive Exposure: Not on Actuary Strain: Not on File (11/18/2021)   Received from [facility]    Financial Resource Strain: 0  Food Insecurity: No Food Insecurity (01/18/2024)   Epic    Worried About Programme Researcher, Broadcasting/film/video in the Last Year: Never true    Ran Out of Food in the Last Year: Never true  Transportation Needs: No Transportation Needs (01/18/2024)   Epic    Lack of Transportation (Medical): No    Lack of Transportation (Non-Medical): No  Physical Activity: Not on File (11/18/2021)   Received from [facility]   Physical Activity    Physical Activity: 0  Stress: Not on File (11/18/2021)   Received from [facility]   Stress    Stress: 0  Social Connections: Not on File (04/27/2023)   Received from [facility]   Social Connections    Connectedness: 0  Intimate Partner Violence: Not At Risk (01/18/2024)   Epic    Fear of Current or Ex-Partner: No    Emotionally Abused: No    Physically Abused: No    Sexually Abused: No  Depression (PHQ2-9): Low Risk (01/18/2024)   Depression (PHQ2-9)    PHQ-2 Score: 0  Alcohol Screen: Not on file  Housing: Unknown (01/18/2024)   Epic    Unable to Pay for Housing in the Last Year: No    Number of Times Moved in the Last Year: Not on file    Homeless in the Last Year: No  Utilities: Not At Risk (01/18/2024)   Epic    Threatened with loss of utilities: No  Health Literacy: Not on file    PHYSICAL EXAM  There were no vitals filed for this visit. There is no height or weight on file to calculate BMI.  Generalized: Well developed, in no acute distress  Neurological examination  Mentation: Alert oriented to time, place, history taking. Follows all commands speech and language fluent Cranial nerve II-XII: Pupils were equal round reactive to light.  Extraocular movements were full, visual field were full on confrontational test. Facial sensation and strength were normal. Uvula tongue midline. Head turning and shoulder shrug  were normal and symmetric. Motor: The motor testing reveals 5 over 5 strength of all 4 extremities. Good symmetric motor tone is noted throughout.  Sensory: Sensory testing is intact to  soft touch on all 4 extremities. No evidence of extinction is noted.  Coordination: Cerebellar testing reveals good finger-nose-finger and heel-to-shin bilaterally.  Gait and station: Gait is normal. Tandem gait is normal. Romberg is negative. No drift is seen.  Reflexes: Deep tendon reflexes are symmetric and normal bilaterally.   DIAGNOSTIC DATA (LABS, IMAGING, TESTING) - I reviewed patient records, labs, notes, testing and imaging myself where available.  Lab Results  Component Value Date   WBC 13.9 (H) 01/18/2024   HGB 12.7 01/18/2024   HCT 38.6 01/18/2024   MCV 86.0 01/18/2024   PLT 304 01/18/2024      Component Value Date/Time   NA 140 01/18/2024 1403   K 3.5 01/18/2024 1403   CL 102 01/18/2024 1403   CO2 30 01/18/2024 1403   GLUCOSE 117 (H) 01/18/2024 1403   BUN 15 01/18/2024 1403   CREATININE 0.68 01/18/2024 1403   CALCIUM 9.9 01/18/2024 1403   PROT 7.6 01/18/2024 1403   ALBUMIN 4.2 01/18/2024 1403   AST 36 01/18/2024 1403   ALT 43 01/18/2024 1403   ALKPHOS 77 01/18/2024 1403   BILITOT 0.5 01/18/2024 1403   GFRNONAA >60 01/18/2024 1403   GFRAA >60 02/03/2020 1658   No results found for: CHOL, HDL, LDLCALC, LDLDIRECT, TRIG, CHOLHDL No results found for: YHAJ8R No results found for: VITAMINB12 No results found for: TSH  Lauraine Born, AGNP-C, DNP 08/14/2024, 4:42 PM [facility] [address], [address] [place], [place] [address] [phone]      [1] No Known Allergies  "

## 2024-08-14 NOTE — Progress Notes (Unsigned)
 SABRA

## 2024-08-15 ENCOUNTER — Ambulatory Visit: Admitting: Neurology

## 2024-08-15 ENCOUNTER — Encounter: Payer: Self-pay | Admitting: Neurology
